# Patient Record
Sex: Female | Born: 1960 | Race: White | Hispanic: No | Smoking: Former smoker
Health system: Southern US, Community
[De-identification: ages and names within clinical notes are randomized; demographics above are authoritative.]

## PROBLEM LIST (undated history)

## (undated) DIAGNOSIS — R112 Nausea with vomiting, unspecified: Secondary | ICD-10-CM

## (undated) DIAGNOSIS — I10 Essential (primary) hypertension: Secondary | ICD-10-CM

## (undated) DIAGNOSIS — G4730 Sleep apnea, unspecified: Secondary | ICD-10-CM

## (undated) DIAGNOSIS — Z87442 Personal history of urinary calculi: Secondary | ICD-10-CM

## (undated) DIAGNOSIS — Z9889 Other specified postprocedural states: Secondary | ICD-10-CM

## (undated) HISTORY — DX: Sleep apnea, unspecified: G47.30

## (undated) HISTORY — DX: Essential (primary) hypertension: I10

## (undated) HISTORY — PX: ABDOMINAL HYSTERECTOMY: SHX81

## (undated) HISTORY — PX: OTHER SURGICAL HISTORY: SHX169

---

## 2016-02-10 ENCOUNTER — Other Ambulatory Visit: Payer: Self-pay | Admitting: General Surgery

## 2016-02-23 ENCOUNTER — Other Ambulatory Visit (HOSPITAL_COMMUNITY): Payer: Self-pay | Admitting: General Surgery

## 2016-02-23 DIAGNOSIS — Z6841 Body Mass Index (BMI) 40.0 and over, adult: Principal | ICD-10-CM

## 2016-03-02 ENCOUNTER — Ambulatory Visit (HOSPITAL_COMMUNITY)
Admission: RE | Admit: 2016-03-02 | Discharge: 2016-03-02 | Disposition: A | Discharge status: Home / Self Care | Payer: BLUE CROSS/BLUE SHIELD | Source: Ambulatory Visit | Attending: General Surgery | Admitting: General Surgery

## 2016-03-02 DIAGNOSIS — Z6841 Body Mass Index (BMI) 40.0 and over, adult: Secondary | ICD-10-CM | POA: Insufficient documentation

## 2016-03-02 DIAGNOSIS — K3184 Gastroparesis: Secondary | ICD-10-CM | POA: Diagnosis not present

## 2016-03-11 ENCOUNTER — Ambulatory Visit: Payer: Self-pay | Admitting: Dietician

## 2016-03-21 ENCOUNTER — Encounter: Payer: Self-pay | Admitting: Dietician

## 2016-03-21 ENCOUNTER — Encounter: Payer: BLUE CROSS/BLUE SHIELD | Attending: General Surgery | Admitting: Dietician

## 2016-03-21 DIAGNOSIS — Z01818 Encounter for other preprocedural examination: Secondary | ICD-10-CM | POA: Insufficient documentation

## 2016-03-21 NOTE — Progress Notes (Signed)
  Pre-Op Assessment Visit:  Pre-Operative RYGB Surgery  Medical Nutrition Therapy:  Appt start time: 1100   End time:  1130.  Patient was seen on 03/21/2016 for Pre-Operative Nutrition Assessment. Assessment and letter of approval faxed to [facility] coordinator on 03/21/2016.   Preferred Learning Style:   No preference indicated   Learning Readiness:   Ready  Handouts given during visit include:  Pre-Op Goals Bariatric Surgery Protein Shakes   During the appointment today the following Pre-Op Goals were reviewed with the patient: Maintain or lose weight as instructed by your surgeon Make healthy food choices Begin to limit portion sizes Limited concentrated sugars and fried foods Keep fat/sugar in the single digits per serving on   food labels Practice CHEWING your food  (aim for 30 chews per bite or until applesauce consistency) Practice not drinking 15 minutes before, during, and 30 minutes after each meal/snack Avoid all carbonated beverages  Avoid/limit caffeinated beverages  Avoid all sugar-sweetened beverages Consume 3 meals per day; eat every 3-5 hours Make a list of non-food related activities Aim for 64-100 ounces of FLUID daily  Aim for at least 60-80 grams of PROTEIN daily Look for a liquid protein source that contain ?15 g protein and ?5 g carbohydrate  (ex: shakes, drinks, shots)  Patient-Centered Goals: Goals: become more active, improving healthy, avoid diabetes and heart disease  9 confidence/9 importance scale  Demonstrated degree of understanding via:  Teach Back  Teaching Method Utilized:  Visual Auditory Hands on  Barriers to learning/adherence to lifestyle change: none  Patient to call the [facility] to enroll in Pre-Op and Post-Op Nutrition Education when surgery date is scheduled.

## 2016-03-21 NOTE — Patient Instructions (Signed)
Follow Pre-Op Goals Try Protein Shakes Call [facility] at [phone] when surgery is scheduled to enroll in Pre-Op Class  Things to remember:  Please always be honest with us. We want to support you!  If you have any questions or concerns in between appointments, please call or email Liz, Leslie, or Laurie.  The diet after surgery will be high protein and low in carbohydrate.  Vitamins and calcium need to be taken for the rest of your life.  Feel free to include support people in any classes or appointments.   Supplement recommendations:  Complete" Multivitamin: Sleeve Gastrectomy and RYGB patients take a double dose of MVI. LAGB patients take single dose as it is written on the package. Vitamin must be liquid or chewable but not gummy. Examples of these include Flintstones Complete and Centrum Complete. If the vitamin is bariatric-specific, take 1 dose as it is already formulated for bariatric surgery patients. Examples of these are Bariatric Advantage, Celebrate, and Wellesse. These can be found at the [facility] and/or online.     Calcium citrate: 1500 mg/day of Calcium citrate (also chewable or liquid) is recommended for all procedures. The body is only able to absorb 500-600 mg of Calcium at one time so 3 daily doses of 500 mg are recommended. Calcium doses must be taken a minimum of 2 hours apart. Additionally, Calcium must be taken 2 hours apart from iron-containing MVI. Examples of brands include Celebrate, Bariatric Advantage, and Wellesse. These brands must be purchased online or at the [facility]. Citracal Petites is the only Calcium citrate supplement found in general grocery stores and pharmacies. This is in tablet form and may be recommended for patients who do not tolerate chewable Calcium.  Continued or added Vitamin D supplementation based on individual needs.    Vitamin B12: 300-500 mcg/day for Sleeve Gastrectomy and RYGB. Optional for  LAGB patients as stomach remains fully intact. Must be taken intramuscularly, sublingually, or inhaled nasally. Oral route is not recommended. 

## 2016-04-21 ENCOUNTER — Encounter: Payer: BLUE CROSS/BLUE SHIELD | Attending: General Surgery | Admitting: Dietician

## 2016-04-21 ENCOUNTER — Encounter: Payer: Self-pay | Admitting: Dietician

## 2016-04-21 DIAGNOSIS — Z01818 Encounter for other preprocedural examination: Secondary | ICD-10-CM | POA: Insufficient documentation

## 2016-04-21 NOTE — Progress Notes (Signed)
  3 Months Supervised Weight Loss Visit:   Pre-Operative RYGB Surgery  Medical Nutrition Therapy:  Appt start time: 1100 end time:  1115.  Primary concerns today: Supervised Weight Loss Visit. Returns with a 2 lbs weight gain since last month. Having a Premier protein shake for breakfast now. Was just eating one meal per day and working on having 3 meals per day consistently. Still not feeling too much hunger throughout the day, but is starting to feel hunger more now that she is eating. Not having fried foods and sometimes will have cakes like pound cakes about once every couple of weeks.   Working on chewing well and not drinking during meals. Not waiting 30 minutes to drink and drinking right before meals at this point.   Having knee and ankle issues so not able to exercise much.   Weight: 314.1 lbs BMI: 47.8  Preferred Learning Style:   No preference indicated   Learning Readiness:   Ready  24-hr recall: B (10 AM): Premier protein Snk (AM): none L (5 PM): sandwich peanut butter and jelly or lunch meat or Marie Calendar's chicken alfredo Snk (PM): none  D (10 PM): Special K or egg sandwich Snk (PM): none  Beverages: water, sweet tea, punch/fruit juice/raspberrry lemonade, soda rarely   Medications: see list   Recent physical activity:  none  Progress Towards Goal(s):  In progress.  Handouts given during visit include:  none   Nutritional Diagnosis:  [place]-3.3 Obesity related to past poor dietary habits and physical inactivity as evidenced by patient attending supervised weight loss for insurance approval of bariatric surgery.    Intervention:  Nutrition counseling provided. Plan: Continue to work on chewing well and not drinking during meals (15 minutes before and waiting 30 minutes after eating). Plan to exercise 3-4 x week in the pool.  Look for Phelps DodgeSmart Ones, WellPointLean Cuisine, or Healthy Choice frozen (ones with meat/protein with vegetables) instead of pasta.  Choose lean  protein and vegetables for most meals. Work on having all drinks sugar free (Crystal Light or Diet V8 Splash).  Teaching Method Utilized:  Visual Auditory Hands on  Barriers to learning/adherence to lifestyle change: none  Demonstrated degree of understanding via:  Teach Back   Monitoring/Evaluation:  Dietary intake, exercise, and body weight. Follow up in 1 months for 3 month supervised weight loss visit.

## 2016-04-21 NOTE — Patient Instructions (Addendum)
Continue to work on chewing well and not drinking during meals (15 minutes before and waiting 30 minutes after eating). Plan to exercise 3-4 x week in the pool.  Look for Phelps DodgeSmart Ones, WellPointLean Cuisine, or Healthy Choice frozen (ones with meat/protein with vegetables) instead of pasta.  Choose lean protein and vegetables for most meals. Work on having all drinks sugar free (Crystal Light or Diet V8 Splash).

## 2016-05-23 ENCOUNTER — Encounter: Payer: BLUE CROSS/BLUE SHIELD | Attending: General Surgery | Admitting: Dietician

## 2016-05-23 ENCOUNTER — Encounter: Payer: Self-pay | Admitting: Dietician

## 2016-05-23 DIAGNOSIS — Z01818 Encounter for other preprocedural examination: Secondary | ICD-10-CM | POA: Diagnosis not present

## 2016-05-23 NOTE — Progress Notes (Signed)
  3 Months Supervised Weight Loss Visit:   Pre-Operative RYGB Surgery  Medical Nutrition Therapy:  Appt start time: 1100 end time:  1115.  Primary concerns today: Supervised Weight Loss Visit. Returns with a 4 lbs weight loss since last month. Having Smart Ones for lunch, exercising in the pool 1-2 x well, no longer drinking raspberry lemonade, and having crystal light. Sometimes has a second protein shake a day (has 1 for breakfast). Still cutting back on sweets (having them less than 1 x week).   Working on chewing well and not drinking during meals. Not waiting 30 minutes to drink and drinking right before meals at this point.   Weight: 310.1 lbs BMI: 47.2  Preferred Learning Style:   No preference indicated   Learning Readiness:   Ready  24-hr recall: B (10 AM): Premier protein Snk (AM): none L (5 PM): sandwich peanut butter and jelly or lunch meat or Smart Ones Snk (PM): none  D (10 PM): Special K or egg sandwich Snk (PM): none  Beverages: water, crystal light  Medications: see list   Recent physical activity:  none  Progress Towards Goal(s):  In progress.  Handouts given during visit include:  none   Nutritional Diagnosis:  [place]-3.3 Obesity related to past poor dietary habits and physical inactivity as evidenced by patient attending supervised weight loss for insurance approval of bariatric surgery.    Intervention:  Nutrition counseling provided. Plan: Continue to work on chewing well and not drinking during meals (15 minutes before and waiting 30 minutes after eating). Plan to exercise 3-4 x week in the pool.  Choose lean protein and vegetables for most meals.  Teaching Method Utilized:  Visual Auditory Hands on  Barriers to learning/adherence to lifestyle change: none  Demonstrated degree of understanding via:  Teach Back   Monitoring/Evaluation:  Dietary intake, exercise, and body weight. Follow up in 1 months for 3 month supervised weight loss  visit.

## 2016-05-23 NOTE — Patient Instructions (Signed)
Continue to work on chewing well and not drinking during meals (15 minutes before and waiting 30 minutes after eating). Plan to exercise 3-4 x week in the pool.  Choose lean protein and vegetables for most meals.

## 2016-06-21 ENCOUNTER — Encounter: Payer: BLUE CROSS/BLUE SHIELD | Attending: General Surgery | Admitting: Dietician

## 2016-06-21 ENCOUNTER — Encounter: Payer: Self-pay | Admitting: Dietician

## 2016-06-21 DIAGNOSIS — Z01818 Encounter for other preprocedural examination: Secondary | ICD-10-CM | POA: Diagnosis not present

## 2016-06-21 NOTE — Progress Notes (Signed)
  3 Months Supervised Weight Loss Visit:   Pre-Operative RYGB Surgery  Medical Nutrition Therapy:  Appt start time: 1105 end time:  1120.  Primary concerns today: Supervised Weight Loss Visit. Returns with no weight change. Still going to the pool 2 x per week. Was on vacation last week. Still struggling with chewing since she is missing some teeth. And feels like she has to drink to clear her mouth out.   Weight: 310.1 lbs BMI: 47.2  Preferred Learning Style:   No preference indicated   Learning Readiness:   Ready  24-hr recall: B (10 AM): Premier protein Snk (AM): none L (5 PM): sandwich peanut butter and jelly or lunch meat or Smart Ones Snk (PM): none  D (10 PM): Special K or egg sandwich Snk (PM): none  Beverages: water, crystal light  Medications: see list   Recent physical activity:  none  Progress Towards Goal(s):  In progress.  Handouts given during visit include:  none   Nutritional Diagnosis:  [place]-3.3 Obesity related to past poor dietary habits and physical inactivity as evidenced by patient attending supervised weight loss for insurance approval of bariatric surgery.    Intervention:  Nutrition counseling provided. Plan: Continue to work on chewing well and not drinking during meals (15 minutes before and waiting 30 minutes after eating). Plan to exercise 3-4 x week in the pool.  Choose lean protein and vegetables for most meals.  Teaching Method Utilized:  Visual Auditory Hands on  Barriers to learning/adherence to lifestyle change: none  Demonstrated degree of understanding via:  Teach Back   Monitoring/Evaluation:  Dietary intake, exercise, and body weight. Follow up to attend Pre Op Class

## 2016-06-21 NOTE — Patient Instructions (Addendum)
Continue to work on chewing well and not drinking during meals (15 minutes before and waiting 30 minutes after eating). Plan to exercise 3-4 x week in the pool.  Choose lean protein and vegetables for most meals.  Supplement recommendations:  Complete" Multivitamin: Sleeve Gastrectomy and RYGB patients take a double dose of MVI. LAGB patients take single dose as it is written on the package. Vitamin must be liquid or chewable but not gummy. Examples of these include Flintstones Complete and Centrum Complete. If the vitamin is bariatric-specific, take 1 dose as it is already formulated for bariatric surgery patients. Examples of these are Bariatric Advantage, Celebrate, and [employer]. These can be found at the [facility] and/or online.     Calcium citrate: 1500 mg/day of Calcium citrate (also chewable or liquid) is recommended for all procedures. The body is only able to absorb 500-600 mg of Calcium at one time so 3 daily doses of 500 mg are recommended. Calcium doses must be taken a minimum of 2 hours apart. Additionally, Calcium must be taken 2 hours apart from iron-containing MVI. Examples of brands include Celebrate, Bariatric Advantage, and Wellesse. These brands must be purchased online or at the [facility]. Citracal Petites is the only Calcium citrate supplement found in general grocery stores and pharmacies. This is in tablet form and may be recommended for patients who do not tolerate chewable Calcium.  Continued or added Vitamin D supplementation based on individual needs.    Vitamin B12: 300-500 mcg/day for Sleeve Gastrectomy and RYGB. Optional for LAGB patients as stomach remains fully intact. Must be taken intramuscularly, sublingually, or inhaled nasally. Oral route is not recommended.

## 2016-08-29 ENCOUNTER — Ambulatory Visit (INDEPENDENT_AMBULATORY_CARE_PROVIDER_SITE_OTHER): Payer: BLUE CROSS/BLUE SHIELD | Admitting: Pulmonary Disease

## 2016-08-29 ENCOUNTER — Encounter: Payer: Self-pay | Admitting: Pulmonary Disease

## 2016-08-29 DIAGNOSIS — G4733 Obstructive sleep apnea (adult) (pediatric): Secondary | ICD-10-CM | POA: Diagnosis not present

## 2016-08-29 MED ORDER — FLUTICASONE PROPIONATE 50 MCG/ACT NA SUSP: 2.0000 | Freq: Every day | NASAL | 10 refills | Status: AC

## 2016-08-29 NOTE — Progress Notes (Signed)
PULMONARY/SLEEP CONSULT NOTE  Requesting MD/Service: Gaynelle Adu Date of initial consultation: 08/29/16 Reason for consultation: OSA  PT PROFILE: 28 Higgins with OSA who requires treatment prior to consideration for gastric bypass surgery  DATA: PSG 11/17/15: AHI 54/hr. Mostly hypopneas. Lowest SpO2 80%. SpO2 below 88% ten percent of time. CPAP recommendation 8 cm H2O Epworth 08/29/16: 0    HPI:  Haley Higgins who is undergoing evaluation for bariatric surgery. In the course of that evaluation, she underwent sleep study which revealed severe OSA (AHI 54/hr). She is required to undergo evaluation and treatment of this prior to surgery. She denies daytime sleepiness and scored a 0 on Epworth sleepiness scale. She is unaware if she snores heavily. She denies nocturnal awakenings due to gasping or choking. She denies AM headache. She was previosuly prescribed CPAP but did not initiate it due to cost  Past Medical History:  Diagnosis Date  . Hypertension   . Sleep apnea     Past Surgical History:  Procedure Laterality Date  . ABDOMINAL HYSTERECTOMY    . CESAREAN SECTION      MEDICATIONS: I have reviewed all medications and confirmed regimen as documented  Social History   Social History  . Marital status: Divorced    Spouse name: N/A  . Number of children: N/A  . Years of education: N/A   Occupational History  . Not on file.   Social History Main Topics  . Smoking status: Former Smoker    Packs/day: 1.00    Years: 20.00    Types: Cigarettes  . Smokeless tobacco: Never Used     Comment: quit smoking 2007  . Alcohol use No  . Drug use: No  . Sexual activity: Not on file   Other Topics Concern  . Not on file   Social History Narrative  . No narrative on file    Family History  Problem Relation Age of Onset  . Heart disease Mother   . Diabetes Mother   . Pancreatic cancer Father   . Diabetes Father   . Diabetes Sister   . Heart disease Brother     ROS: No fever,  myalgias/arthralgias, unexplained weight loss or weight gain No new focal weakness or sensory deficits No otalgia, hearing loss, visual changes, nasal and sinus symptoms, mouth and throat problems No neck pain or adenopathy No abdominal pain, N/V/D, diarrhea, change in bowel pattern No dysuria, change in urinary pattern   Vitals:   08/29/16 1023  BP: 132/82  Pulse: 89  SpO2: 97%  Weight: (!) 309 lb (140.2 kg)  Height: 5\' 9"  (1.753 m)     EXAM:  Gen: Obese, No overt respiratory distress HEENT: NCAT, sclera white, oropharynx normal, very mil rhinitis Neck: Supple without LAN, thyromegaly, JVD Lungs: breath sounds: full, percussion: normal, No adventitious sounds Cardiovascular: RRR, no murmurs noted Abdomen: Soft, nontender, normal BS Ext: without clubbing, cyanosis, edema Neuro: CNs grossly intact, motor and sensory intact Skin: Limited exam, no lesions noted  DATA:   No flowsheet data found.  No flowsheet data found.  CXR:    IMPRESSION:     ICD-9-CM ICD-10-CM   1. Obesity, morbid (HCC) 278.01 E66.01   2. OSA (obstructive sleep apnea) 327.23 G47.33 Ambulatory Referral for DME  3. Very mild rhinitis  OSA is sever by AHI but she is remarkably asymptomatic. Nonetheless, given severity, it warrants attempt at treatment  PLAN:  1) Initiate CPAP with autoset machine 5-20 cm H2O 2) Flonase - 2 sprays per nostril  daily 3) ROV 6 weeks   Billy Fischeravid Cohan Stipes, MD PCCM service Mobile [phone] Pager [phone] 08/29/2016

## 2016-08-29 NOTE — Patient Instructions (Signed)
Begin CPAP - autoset Follow up in 4-6 weeks

## 2016-09-02 ENCOUNTER — Telehealth: Payer: Self-pay | Admitting: Pulmonary Disease

## 2016-09-02 NOTE — Telephone Encounter (Signed)
Pt was told to call us if she did not hear from company who was to help get her  CPAP machine  Please advise.

## 2016-09-02 NOTE — Telephone Encounter (Signed)
[facility]. Pt advised that it can take a week or so to hear from DME company.  Will route to [place] to f/u.

## 2016-09-02 NOTE — Telephone Encounter (Signed)
ATC patient, no answer.  LMOAM for pt to return my call to [phone] to advise of status of [facility] with [facility]. Rhonda J Cobb

## 2016-09-02 NOTE — Telephone Encounter (Signed)
Spoke with patient and patient is aware that we are trying to locate her sleep study, and that she may have to have another sleep study per [facility]. Rhonda J Cobb

## 2016-09-05 ENCOUNTER — Other Ambulatory Visit: Payer: Self-pay | Admitting: *Deleted

## 2016-09-05 DIAGNOSIS — G4733 Obstructive sleep apnea (adult) (pediatric): Secondary | ICD-10-CM

## 2016-09-05 NOTE — Telephone Encounter (Signed)
Sleep Study received and will have scanned into Epic. Haley Higgins Pt is aware. Haley Higgins

## 2016-09-05 NOTE — Telephone Encounter (Signed)
Called and spoke with patient to locate sleep study. Pt stated that Dr. Andrey CampanileWilson ordered the study and should be able to obtain study from his office. Called and spoke with Scarlette CalicoFrances at Dr. Tawana ScaleWilson's office and she will fax sleep study to us at [phone]. Rhonda J Cobb

## 2016-09-26 ENCOUNTER — Ambulatory Visit: Payer: BLUE CROSS/BLUE SHIELD | Admitting: Internal Medicine

## 2016-10-05 ENCOUNTER — Institutional Professional Consult (permissible substitution): Payer: BLUE CROSS/BLUE SHIELD | Admitting: Pulmonary Disease

## 2016-10-10 ENCOUNTER — Encounter: Payer: BLUE CROSS/BLUE SHIELD | Attending: General Surgery | Admitting: Dietician

## 2016-10-10 ENCOUNTER — Encounter: Payer: Self-pay | Admitting: Dietician

## 2016-10-10 DIAGNOSIS — I10 Essential (primary) hypertension: Secondary | ICD-10-CM | POA: Diagnosis not present

## 2016-10-10 DIAGNOSIS — M1711 Unilateral primary osteoarthritis, right knee: Secondary | ICD-10-CM | POA: Diagnosis not present

## 2016-10-10 DIAGNOSIS — Z713 Dietary counseling and surveillance: Secondary | ICD-10-CM | POA: Diagnosis present

## 2016-10-10 DIAGNOSIS — R7303 Prediabetes: Secondary | ICD-10-CM | POA: Diagnosis not present

## 2016-10-10 DIAGNOSIS — Z6841 Body Mass Index (BMI) 40.0 and over, adult: Secondary | ICD-10-CM | POA: Diagnosis not present

## 2016-10-10 DIAGNOSIS — M47817 Spondylosis without myelopathy or radiculopathy, lumbosacral region: Secondary | ICD-10-CM | POA: Insufficient documentation

## 2016-10-10 NOTE — Progress Notes (Signed)
  Pre-Operative Nutrition Class:  Appt start time: 830   End time:  930.  Patient was seen on 10/10/2016 for Pre-Operative Bariatric Surgery Education at the [facility].   Surgery date:  Surgery type: RYGB Start weight at [facility]: 313 lbs on 03/21/2016 Weight today: 308.8 lbs  TANITA  BODY COMP RESULTS  10/10/16   BMI (kg/m^2) 47   Fat Mass (lbs) 175.4   Fat Free Mass (lbs) 133.4   Total Body Water (lbs) 99.6   Samples given per MNT protocol. Patient educated on appropriate usage: Bariatric Advantage multivitamin (mixed fruit - qty 1) Lot #: [id number] Exp: 09/2017  Premier protein shake (strawberry - qty 1) Lot #: [id number] Exp: 08/2017  Bariatric Advantage Vitamins Calcium Citrate chew (lemon - qty 1) Lot #: [id number] Exp: 04/2017  The following the learning objectives were met by the patient during this course:  Identify Pre-Op Dietary Goals and will begin 2 weeks pre-operatively  Identify appropriate sources of fluids and proteins   State protein recommendations and appropriate sources pre and post-operatively  Identify Post-Operative Dietary Goals and will follow for 2 weeks post-operatively  Identify appropriate multivitamin and calcium sources  Describe the need for physical activity post-operatively and will follow MD recommendations  State when to call healthcare provider regarding medication questions or post-operative complications  Handouts given during class include:  Pre-Op Bariatric Surgery Diet Handout  Protein Shake Handout  Post-Op Bariatric Surgery Nutrition Handout  BELT Program Information Flyer  Support Group Information Flyer  [facility] Bariatric Supplements Price List  Follow-Up Plan: Patient will follow-up at [facility] 2 weeks post operatively for diet advancement per MD.

## 2016-10-11 ENCOUNTER — Ambulatory Visit: Payer: BLUE CROSS/BLUE SHIELD | Admitting: Pulmonary Disease

## 2016-10-18 NOTE — Progress Notes (Signed)
Surgery on 12/5.  Needs orders in EPIc.

## 2016-10-18 NOTE — Patient Instructions (Signed)
Jolyne LoaSusan Templeman  10/18/2016   Your procedure is scheduled on: 10/25/2016    [facility]  [place]  elevators to 3rd floor to  Short Stay Center at   0515 AM.  Call this number if you have problems the morning of surgery [phone]   Remember: ONLY 1 PERSON MAY GO WITH YOU TO SHORT STAY TO GET  READY MORNING OF YOUR SURGERY.  Do not eat food or drink liquids :After Midnight.     Take these medicines the morning of surgery with A SIP OF WATER: flonase                                 You may not have any metal on your body including hair pins and              piercings  Do not wear jewelry, make-up, lotions, powders or perfumes, deodorant             Do not wear nail polish.  Do not shave  48 hours prior to surgery.                 Do not bring valuables to the hospital. [facility] IS NOT             RESPONSIBLE   FOR VALUABLES.  Contacts, dentures or bridgework may not be worn into surgery.  Leave suitcase in the car. After surgery it may be brought to your room.       Special Instructions: N/A              Please read over the following fact sheets you were given: _____________________________________________________________________             [facility] - Preparing for [facility] Before surgery, you can play an important role.  Because skin is not sterile, your skin needs to be as free of germs as possible.  You can reduce the number of germs on your skin by washing with CHG (chlorahexidine gluconate) soap before surgery.  CHG is an antiseptic cleaner which kills germs and bonds with the skin to continue killing germs even after washing. Please DO NOT use if you have an allergy to CHG or antibacterial soaps.  If your skin becomes reddened/irritated stop using the CHG and inform your nurse when you arrive at Short Stay. Do not shave (including legs and underarms) for at least 48 hours prior to the first CHG shower.  You may shave  your face/neck. Please follow these instructions carefully:  1.  Shower with CHG Soap the night before surgery and the  morning of Surgery.  2.  If you choose to wash your hair, wash your hair first as usual with your  normal  shampoo.  3.  After you shampoo, rinse your hair and body thoroughly to remove the  shampoo.                           4.  Use CHG as you would any other liquid soap.  You can apply chg directly  to the skin and wash                       Gently with a scrungie or clean washcloth.  5.  Apply the CHG Soap to your body ONLY FROM THE NECK DOWN.   Do not use on face/ open                           Wound or open sores. Avoid contact with eyes, ears mouth and genitals (private parts).                       Wash face,  Genitals (private parts) with your normal soap.             6.  Wash thoroughly, paying special attention to the area where your surgery  will be performed.  7.  Thoroughly rinse your body with warm water from the neck down.  8.  DO NOT shower/wash with your normal soap after using and rinsing off  the CHG Soap.                9.  Pat yourself dry with a clean towel.            10.  Wear clean pajamas.            11.  Place clean sheets on your bed the night of your first shower and do not  sleep with pets. Day of Surgery : Do not apply any lotions/deodorants the morning of surgery.  Please wear clean clothes to the hospital/surgery center.  FAILURE TO FOLLOW THESE INSTRUCTIONS MAY RESULT IN THE CANCELLATION OF YOUR SURGERY PATIENT SIGNATURE_________________________________  NURSE SIGNATURE__________________________________  ________________________________________________________________________

## 2016-10-19 ENCOUNTER — Ambulatory Visit: Payer: Self-pay | Admitting: General Surgery

## 2016-10-19 ENCOUNTER — Ambulatory Visit (HOSPITAL_COMMUNITY)
Admission: RE | Admit: 2016-10-19 | Discharge: 2016-10-19 | Disposition: A | Discharge status: Home / Self Care | Payer: BLUE CROSS/BLUE SHIELD | Source: Ambulatory Visit | Attending: General Surgery | Admitting: General Surgery

## 2016-10-19 ENCOUNTER — Encounter (HOSPITAL_COMMUNITY): Payer: Self-pay

## 2016-10-19 ENCOUNTER — Encounter (HOSPITAL_COMMUNITY)
Admission: RE | Admit: 2016-10-19 | Discharge: 2016-10-19 | Disposition: A | Discharge status: Home / Self Care | Payer: BLUE CROSS/BLUE SHIELD | Source: Ambulatory Visit | Attending: General Surgery | Admitting: General Surgery

## 2016-10-19 DIAGNOSIS — I700 Atherosclerosis of aorta: Secondary | ICD-10-CM | POA: Insufficient documentation

## 2016-10-19 DIAGNOSIS — Z87891 Personal history of nicotine dependence: Secondary | ICD-10-CM | POA: Diagnosis not present

## 2016-10-19 DIAGNOSIS — Z01818 Encounter for other preprocedural examination: Secondary | ICD-10-CM | POA: Diagnosis not present

## 2016-10-19 DIAGNOSIS — G4730 Sleep apnea, unspecified: Secondary | ICD-10-CM | POA: Insufficient documentation

## 2016-10-19 HISTORY — DX: Personal history of urinary calculi: Z87.442

## 2016-10-19 HISTORY — DX: Nausea with vomiting, unspecified: R11.2

## 2016-10-19 HISTORY — DX: Other specified postprocedural states: Z98.890

## 2016-10-19 LAB — CBC WITH DIFFERENTIAL/PLATELET
BASOS PCT: 1 %
Basophils Absolute: 0 10*3/uL (ref 0.0–0.1)
EOS ABS: 0.3 10*3/uL (ref 0.0–0.7)
Eosinophils Relative: 5 %
HEMATOCRIT: 43.3 % (ref 36.0–46.0)
HEMOGLOBIN: 14.2 g/dL (ref 12.0–15.0)
LYMPHS ABS: 2.1 10*3/uL (ref 0.7–4.0)
Lymphocytes Relative: 32 %
MCH: 28 pg (ref 26.0–34.0)
MCHC: 32.8 g/dL (ref 30.0–36.0)
MCV: 85.2 fL (ref 78.0–100.0)
MONO ABS: 0.4 10*3/uL (ref 0.1–1.0)
MONOS PCT: 6 %
NEUTROS ABS: 3.9 10*3/uL (ref 1.7–7.7)
Neutrophils Relative %: 56 %
Platelets: 271 10*3/uL (ref 150–400)
RBC: 5.08 MIL/uL (ref 3.87–5.11)
RDW: 13.7 % (ref 11.5–15.5)
WBC: 6.8 10*3/uL (ref 4.0–10.5)

## 2016-10-19 LAB — COMPREHENSIVE METABOLIC PANEL
ALBUMIN: 3.9 g/dL (ref 3.5–5.0)
ALK PHOS: 75 U/L (ref 38–126)
ALT: 27 U/L (ref 14–54)
ANION GAP: 8 (ref 5–15)
AST: 28 U/L (ref 15–41)
BILIRUBIN TOTAL: 0.9 mg/dL (ref 0.3–1.2)
BUN: 12 mg/dL (ref 6–20)
CALCIUM: 9.3 mg/dL (ref 8.9–10.3)
CO2: 26 mmol/L (ref 22–32)
Chloride: 102 mmol/L (ref 101–111)
Creatinine, Ser: 0.86 mg/dL (ref 0.44–1.00)
GFR calc non Af Amer: >60 mL/min (ref >60)
GLUCOSE: 141 mg/dL — AB (ref 65–99)
POTASSIUM: 3.7 mmol/L (ref 3.5–5.1)
Sodium: 136 mmol/L (ref 135–145)
TOTAL PROTEIN: 7 g/dL (ref 6.5–8.1)

## 2016-10-19 NOTE — Progress Notes (Signed)
AT preop appointment patient had not yet been given bowel prep instructions.  Called office of [facility] and spoke with Nettie ElmSylvia and she told me to have patient come by the office and pick up bowel prep instructions.  Patient instructed to go by office after preop appointment and pick up bowel prep instructions.  Patient voiced understanding.

## 2016-10-19 NOTE — Progress Notes (Signed)
EKG- 03/02/16- EPIC  

## 2016-10-20 ENCOUNTER — Ambulatory Visit: Payer: Self-pay | Admitting: General Surgery

## 2016-10-20 NOTE — H&P (Signed)
Haley Higgins 10/20/2016 3:08 PM Location: Standard Office Patient #: [phone] DOB: [birth year] Divorced / Language: Cleophus Molt / Race: White Female  History of Present Illness Randall Hiss M. Tyler Cubit MD; 10/20/2016 3:47 PM) Patient words: preop.  The patient is a 55 year old female who presents for a pre-op visit. she comes in today for her preoperative visit. She has been scheduled and approved for laparoscopic Roux-en-Y gastric bypass. I initially met her in March 2017. Her weight at that time was 311 pounds. She denies any changes since she was initially seen. She finally agreed to see a pulmonologist to rediscuss CPAP therapy for her positive sleep study. She has been on CPAP for a few weeks. She has a nasal mask. But she complains that it dries out her nose. She has a follow-up appointment with the pulmonologist this coming Monday. Otherwise she denies any medical changes. She denies any chest pain, chest pressure, shortness of breath, dyspnea on exertion, reflux, melena, hematochezia, dysuria, hematuria.  Her upper GI was unremarkable. Her evaluation labs showed a triglyceride level of 155, HDL level was low at 43, she has a diagnosis of subclinical hypothyroidism and her TSH was 5.61. Her hemoglobin A1c was 6.4.   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 Hysterectomy (not due to cancer) - Complete Oral Surgery Cesarean Section - 1  Diagnostic Studies History Gayland Curry, MD; 10/20/2016 3:48 PM) Colonoscopy  within last year Mammogram within last year Pap Smear >5 years ago  Allergies Aleatha Borer, LPN; 78/29/5621 3:08 PM) No Known Drug Allergies 02/10/2016  Medication History Gayland Curry, MD; 10/20/2016 3:48 PM) Lisinopril-Hydrochlorothiazide (10-12.5MG Tablet, Oral) Active. Medications Reconciled Pantoprazole Sodium (40MG Tablet DR, 1 (one) Tablet Oral daily, Taken starting 10/20/2016) Active. Ondansetron (4MG Tablet Disint, 1 (one) Tablet Oral every six hours, as needed, Taken starting 10/20/2016) Active. OxyCODONE HCl (5MG/5ML Solution, 5-10 Milliliter Oral every four hours, as needed, Taken starting 10/20/2016) Active.  Social History Gayland Curry, MD; 10/20/2016 3:48 PM) Tobacco use Former smoker. Alcohol use Occasional alcohol use. No drug use Caffeine use Tea.  Family History Gayland Curry, MD; 10/20/2016 3:48 PM) Colon Cancer Sister. Malignant Neoplasm Of Pancreas Father. Hypertension Mother. Diabetes Mellitus Father, Mother, Sister. Heart Disease Brother, Father, Mother. Heart disease in female family member before age 37  Pregnancy / Birth History Gayland Curry, MD; 10/20/2016 3:48 PM) Age at menarche 63 years. Gravida 4 Contraceptive History Oral contraceptives. Para 5 Maternal age 50-20     Review of Systems Randall Hiss M. Amita Atayde MD; 10/20/2016 3:44 PM) General Not Present- Appetite Loss, Chills, Fatigue, Fever, Night Sweats, Weight Gain and Weight Loss. Skin Present- Dryness. Not Present- Change in Wart/Mole, Hives, Jaundice, New Lesions, Non-Healing Wounds, Rash and Ulcer. HEENT Present- Wears glasses/contact lenses. Not Present- Earache, Hearing Loss, Hoarseness, Nose Bleed, Oral Ulcers, Ringing in the Ears, Seasonal Allergies, Sinus Pain, Sore Throat, Visual Disturbances and Yellow Eyes. Respiratory Not Present- Bloody sputum, Chronic Cough, Difficulty Breathing, Snoring and Wheezing. Breast Not Present- Breast Mass, Breast Pain, Nipple  Discharge and Skin Changes. Cardiovascular Not Present- Chest Pain, Difficulty Breathing Lying Down, Leg Cramps, Palpitations, Rapid Heart  Rate, Shortness of Breath and Swelling of Extremities. Gastrointestinal Present- Hemorrhoids. Not Present- Abdominal Pain, Bloating, Bloody Stool, Change in Bowel Habits, Chronic diarrhea, Constipation, Difficulty Swallowing, Excessive gas, Gets full quickly at meals, Indigestion, Nausea, Rectal Pain and Vomiting. Female Genitourinary Present- Urgency. Not Present- Frequency, Nocturia, Painful Urination and Pelvic Pain. Musculoskeletal Present- Back Pain. Not Present- Joint Pain, Joint Stiffness, Muscle Pain, Muscle Weakness and Swelling of Extremities. Neurological Not Present- Decreased Memory, Fainting, Headaches, Numbness, Seizures, Tingling, Tremor, Trouble walking and Weakness. Psychiatric Not Present- Anxiety, Bipolar, Change in Sleep Pattern, Depression, Fearful and Frequent crying. Endocrine Not Present- Cold Intolerance, Excessive Hunger, Hair Changes, Heat Intolerance, Hot flashes and New Diabetes. Hematology Not Present- Easy Bruising, Excessive bleeding, Gland problems, HIV and Persistent Infections.  Vitals (Ammie Eversole LPN; 93/71/6967 8:93 PM) 10/20/2016 3:17 PM Weight: 309.4 lb Height: 68in Body Surface Area: 2.46 m Body Mass Index: 47.04 kg/m  Temp.: 98.71F(Oral)  Pulse: 86 (Regular)  BP: 118/78 (Sitting, Left Arm, Standard)      Physical Exam Randall Hiss M. Jamaria Amborn MD; 10/20/2016 3:44 PM)  General Mental Status-Alert. General Appearance-Consistent with stated age. Hydration-Well hydrated. Voice-Normal. Note: morbidly obese, evenly distributed  Head and Neck Head-normocephalic, atraumatic with no lesions or palpable masses. Trachea-midline. Thyroid Gland Characteristics - normal size and consistency.  Eye Eyeball - Bilateral-Extraocular movements intact. Sclera/Conjunctiva - Bilateral-No scleral  icterus.  Chest and Lung Exam Chest and lung exam reveals -quiet, even and easy respiratory effort with no use of accessory muscles and on auscultation, normal breath sounds, no adventitious sounds and normal vocal resonance. Inspection Chest Wall - Normal. Back - normal.  Breast - Did not examine.  Cardiovascular Cardiovascular examination reveals -normal heart sounds, regular rate and rhythm with no murmurs and normal pedal pulses bilaterally.  Abdomen Inspection Inspection of the abdomen reveals - No Hernias. Skin - Scar - no surgical scars. Palpation/Percussion Palpation and Percussion of the abdomen reveal - Soft, Non Tender, No Rebound tenderness, No Rigidity (guarding) and No hepatosplenomegaly. Auscultation Auscultation of the abdomen reveals - Bowel sounds normal.  Peripheral Vascular Upper Extremity Palpation - Pulses bilaterally normal.  Neurologic Neurologic evaluation reveals -alert and oriented x 3 with no impairment of recent or remote memory. Mental Status-Normal.  Neuropsychiatric The patient's mood and affect are described as -normal. Judgment and Insight-insight is appropriate concerning matters relevant to self.  Musculoskeletal Normal Exam - Left-Upper Extremity Strength Normal and Lower Extremity Strength Normal. Normal Exam - Right-Upper Extremity Strength Normal and Lower Extremity Strength Normal. Note: b/l knee crepitus  Lymphatic Head & Neck  General Head & Neck Lymphatics: Bilateral - Description - Normal. Axillary - Did not examine. Femoral & Inguinal - Did not examine.    Assessment & Plan Randall Hiss M. Alyric Parkin MD; 10/20/2016 3:44 PM)  MORBID OBESITY WITH BMI OF 45.0-49.9, ADULT (E66.01) Impression: wwe reviewed her preoperative workup. We discussed the results of her labs and upper GI. We disscussed the typical recovery course. We also discussed the typical hospital course. She was given her postoperative pain, heartburn, and  nausea prescriptions today. We discussed the importance of the preoperative bowel prep. we also discussed the preoperative diet plan.  Current Plans Started OxyCODONE HCl 5MG/5ML, 5-10 Milliliter every four hours, as needed, 150 Milliliter, 10/20/2016, No Refill. Started Pantoprazole Sodium 40MG, 1 (one) Tablet daily, #30, 30 days starting 10/20/2016, Ref. x3. Started Ondansetron 4MG, 1 (one) Tablet every six hours, as needed, #15, 10/20/2016, No Refill. Pt Education - EMW_preopbariatric PREDIABETES (R73.03)  OSTEOARTHRITIS OF RIGHT KNEE,  UNSPECIFIED OSTEOARTHRITIS TYPE (M17.11)  OBSTRUCTIVE SLEEP APNEA (G47.33) Impression: wwe discussed that often times there are issues when first going on CPAP. She has a follow-up appointment with the pulmonologist is coming Monday.  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 Redmond Pulling, MD, FACS General, Bariatric, & Minimally Invasive Surgery [facility], [place]

## 2016-10-24 ENCOUNTER — Ambulatory Visit (INDEPENDENT_AMBULATORY_CARE_PROVIDER_SITE_OTHER): Payer: BLUE CROSS/BLUE SHIELD | Admitting: Pulmonary Disease

## 2016-10-24 ENCOUNTER — Encounter: Payer: Self-pay | Admitting: Pulmonary Disease

## 2016-10-24 VITALS — BP 134/72 | HR 94 | Ht 69.0 in | Wt 310.0 lb

## 2016-10-24 DIAGNOSIS — G4733 Obstructive sleep apnea (adult) (pediatric): Secondary | ICD-10-CM | POA: Diagnosis not present

## 2016-10-24 NOTE — Patient Instructions (Signed)
-   Good luck with your surgery tomorrow - After the surgery, you can try on and off the CPAP to see if it is making any difference in your symptoms  - Follow up in 2-3 months or as neded - Happy Holidays!!

## 2016-10-24 NOTE — Progress Notes (Signed)
PULMONARY/SLEEP FOLLOW UP NOTE  Requesting MD/Service: Gaynelle AduEric Wilson Date of initial consultation: 08/29/16 Reason for consultation: OSA  PT PROFILE: [age] F with OSA who requires treatment prior to consideration for gastric bypass surgery  DATA: PSG 11/17/15: AHI 54/hr. Mostly hypopneas. Lowest SpO2 80%. SpO2 below 88% ten percent of time. CPAP recommendation 8 cm H2O [place]worth 08/29/16: 0  CPAP compliance 09/2016: > 4 hrs 28/30 nights. Average usage 6.5 hrs. Maximum pressure on autoset 11.5 cm H2O. Median pressure 6.5 cm H2O PFTs 10/04/16: No obstruction, moderate restriction, moderate reduction in DLCO which corrects fully for [facility] CXR 10/19/16: NACPD  SUBJ: No new complaints. She has been compliant with CPAP but is unable to discern any benefit in terms of symptoms of sleepiness since she reported no daytime sleepiness prior to initiation. Bariatric surgery (roux-en-Y) is scheduled for 10/25/16.   OBJ:  Vitals:   10/24/16 0954  BP: 134/72  Pulse: 94  SpO2: 95%  Weight: (!) 310 lb (140.6 kg)  Height: 5\' 9"  (1.753 m)     EXAM:  Gen: Obese, NAD HEENT: WNL Lungs: BS full, No adventitious sounds Cardiovascular: Reg, no murmurs Abdomen: Soft, nontender, normal BS Ext: without clubbing, cyanosis, edema Neuro: grossly intact  DATA:  CXR:  As above  IMPRESSION:     ICD-9-CM ICD-10-CM   1. OSA (obstructive sleep apnea) 327.23 G47.33    Her AHI indicates moderate to severe OSA but the respiratory disturbances are almost exclusively hypoopneas and she is remarkably asymptomatic. It is hard to know how much CPAP is benfiting her. The OSA should get better with anticipated weight loss after bariatric surgery  PLAN:  1) Cont CPAP with autoset machine 5-20 cm H2O. After surgery, she may try on/off CPAP to assess symptomatic benefit 2) Continue Flonase - 2 sprays per nostril daily 3) ROV 2-3 months   Billy Fischeravid Simonds, MD PCCM service Mobile [phone] Pager  [phone] 10/24/2016

## 2016-10-25 ENCOUNTER — Inpatient Hospital Stay (HOSPITAL_COMMUNITY)
Admission: RE | Admit: 2016-10-25 | Discharge: 2016-10-27 | DRG: 982 | Disposition: A | Discharge status: Home / Self Care | Payer: BLUE CROSS/BLUE SHIELD | Source: Ambulatory Visit | Attending: General Surgery | Admitting: General Surgery

## 2016-10-25 ENCOUNTER — Encounter (HOSPITAL_COMMUNITY)
Admission: RE | Disposition: A | Discharge status: Home / Self Care | Payer: Self-pay | Source: Ambulatory Visit | Attending: General Surgery

## 2016-10-25 ENCOUNTER — Inpatient Hospital Stay (HOSPITAL_COMMUNITY): Payer: BLUE CROSS/BLUE SHIELD | Admitting: Anesthesiology

## 2016-10-25 ENCOUNTER — Encounter (HOSPITAL_COMMUNITY): Payer: Self-pay | Admitting: *Deleted

## 2016-10-25 DIAGNOSIS — R7303 Prediabetes: Secondary | ICD-10-CM | POA: Diagnosis present

## 2016-10-25 DIAGNOSIS — G4733 Obstructive sleep apnea (adult) (pediatric): Secondary | ICD-10-CM | POA: Diagnosis present

## 2016-10-25 DIAGNOSIS — Z87891 Personal history of nicotine dependence: Secondary | ICD-10-CM

## 2016-10-25 DIAGNOSIS — Z6841 Body Mass Index (BMI) 40.0 and over, adult: Secondary | ICD-10-CM

## 2016-10-25 DIAGNOSIS — Z8249 Family history of ischemic heart disease and other diseases of the circulatory system: Secondary | ICD-10-CM

## 2016-10-25 DIAGNOSIS — M47817 Spondylosis without myelopathy or radiculopathy, lumbosacral region: Secondary | ICD-10-CM | POA: Diagnosis present

## 2016-10-25 DIAGNOSIS — Z800 Family history of malignant neoplasm of digestive organs: Secondary | ICD-10-CM

## 2016-10-25 DIAGNOSIS — Z833 Family history of diabetes mellitus: Secondary | ICD-10-CM | POA: Diagnosis not present

## 2016-10-25 DIAGNOSIS — E786 Lipoprotein deficiency: Secondary | ICD-10-CM | POA: Diagnosis present

## 2016-10-25 DIAGNOSIS — E662 Morbid (severe) obesity with alveolar hypoventilation: Principal | ICD-10-CM | POA: Diagnosis present

## 2016-10-25 DIAGNOSIS — I10 Essential (primary) hypertension: Secondary | ICD-10-CM | POA: Diagnosis present

## 2016-10-25 DIAGNOSIS — M1711 Unilateral primary osteoarthritis, right knee: Secondary | ICD-10-CM | POA: Diagnosis present

## 2016-10-25 DIAGNOSIS — Z9884 Bariatric surgery status: Secondary | ICD-10-CM

## 2016-10-25 DIAGNOSIS — Z9989 Dependence on other enabling machines and devices: Secondary | ICD-10-CM

## 2016-10-25 HISTORY — PX: GASTRIC ROUX-EN-Y: SHX5262

## 2016-10-25 LAB — HEMOGLOBIN AND HEMATOCRIT, BLOOD
HEMATOCRIT: 43.8 % (ref 36.0–46.0)
Hemoglobin: 14.2 g/dL (ref 12.0–15.0)

## 2016-10-25 SURGERY — LAPAROSCOPIC ROUX-EN-Y GASTRIC BYPASS WITH UPPER ENDOSCOPY
Anesthesia: General

## 2016-10-25 MED ORDER — FENTANYL CITRATE (PF) 250 MCG/5ML IJ SOLN
INTRAMUSCULAR | Status: AC
  Filled 2016-10-25: qty 5

## 2016-10-25 MED ORDER — PROMETHAZINE HCL 25 MG/ML IJ SOLN
6.2500 mg | INTRAMUSCULAR | Status: DC | PRN
  Administered 2016-10-25: 6.25 mg via INTRAVENOUS

## 2016-10-25 MED ORDER — PROMETHAZINE HCL 25 MG/ML IJ SOLN: 12.5000 mg | Freq: Four times a day (QID) | INTRAMUSCULAR | Status: DC | PRN

## 2016-10-25 MED ORDER — SODIUM CHLORIDE 0.9 % IJ SOLN
INTRAMUSCULAR | Status: AC
  Filled 2016-10-25: qty 50

## 2016-10-25 MED ORDER — BUPIVACAINE LIPOSOME 1.3 % IJ SUSP
20.0000 mL | Freq: Once | INTRAMUSCULAR | Status: DC
  Filled 2016-10-25: qty 20

## 2016-10-25 MED ORDER — FENTANYL CITRATE (PF) 100 MCG/2ML IJ SOLN
INTRAMUSCULAR | Status: DC | PRN
  Administered 2016-10-25 (×2): 50 ug via INTRAVENOUS
  Administered 2016-10-25: 100 ug via INTRAVENOUS

## 2016-10-25 MED ORDER — MIDAZOLAM HCL 2 MG/2ML IJ SOLN
INTRAMUSCULAR | Status: AC
  Filled 2016-10-25: qty 2

## 2016-10-25 MED ORDER — SUGAMMADEX SODIUM 500 MG/5ML IV SOLN
INTRAVENOUS | Status: DC | PRN
  Administered 2016-10-25: 300 mg via INTRAVENOUS

## 2016-10-25 MED ORDER — SCOPOLAMINE 1 MG/3DAYS TD PT72
MEDICATED_PATCH | TRANSDERMAL | Status: DC | PRN
  Administered 2016-10-25: 1 via TRANSDERMAL

## 2016-10-25 MED ORDER — ENOXAPARIN SODIUM 30 MG/0.3ML ~~LOC~~ SOLN
30.0000 mg | Freq: Two times a day (BID) | SUBCUTANEOUS | Status: DC
  Administered 2016-10-26 – 2016-10-27 (×3): 30 mg via SUBCUTANEOUS
  Filled 2016-10-25 (×3): qty 0.3

## 2016-10-25 MED ORDER — EPHEDRINE 5 MG/ML INJ
INTRAVENOUS | Status: AC
  Filled 2016-10-25: qty 10

## 2016-10-25 MED ORDER — CEFOTETAN DISODIUM-DEXTROSE 2-2.08 GM-% IV SOLR
2.0000 g | INTRAVENOUS | Status: AC
  Administered 2016-10-25: 2 g via INTRAVENOUS

## 2016-10-25 MED ORDER — DIPHENHYDRAMINE HCL 50 MG/ML IJ SOLN: 12.5000 mg | Freq: Three times a day (TID) | INTRAMUSCULAR | Status: DC | PRN

## 2016-10-25 MED ORDER — STERILE WATER FOR IRRIGATION IR SOLN
Status: DC | PRN
  Administered 2016-10-25: 1000 mL

## 2016-10-25 MED ORDER — SCOPOLAMINE 1 MG/3DAYS TD PT72
MEDICATED_PATCH | TRANSDERMAL | Status: AC
  Filled 2016-10-25: qty 1

## 2016-10-25 MED ORDER — PROPOFOL 10 MG/ML IV BOLUS
INTRAVENOUS | Status: DC | PRN
  Administered 2016-10-25: 250 mg via INTRAVENOUS
  Administered 2016-10-25: 20 mg via INTRAVENOUS

## 2016-10-25 MED ORDER — GLYCOPYRROLATE 0.2 MG/ML IJ SOLN
INTRAMUSCULAR | Status: DC | PRN
  Administered 2016-10-25: 0.2 mg via INTRAVENOUS

## 2016-10-25 MED ORDER — PREMIER PROTEIN SHAKE
2.0000 [oz_av] | ORAL | Status: DC
  Administered 2016-10-27 (×2): 2 [oz_av] via ORAL

## 2016-10-25 MED ORDER — PROPOFOL 10 MG/ML IV BOLUS
INTRAVENOUS | Status: AC
  Filled 2016-10-25: qty 40

## 2016-10-25 MED ORDER — HYDROMORPHONE HCL 1 MG/ML IJ SOLN
INTRAMUSCULAR | Status: AC
  Filled 2016-10-25: qty 0.5

## 2016-10-25 MED ORDER — ROCURONIUM BROMIDE 100 MG/10ML IV SOLN
INTRAVENOUS | Status: DC | PRN
  Administered 2016-10-25: 50 mg via INTRAVENOUS
  Administered 2016-10-25: 20 mg via INTRAVENOUS
  Administered 2016-10-25 (×2): 10 mg via INTRAVENOUS
  Administered 2016-10-25: 20 mg via INTRAVENOUS
  Administered 2016-10-25: 10 mg via INTRAVENOUS

## 2016-10-25 MED ORDER — DEXAMETHASONE SODIUM PHOSPHATE 10 MG/ML IJ SOLN
INTRAMUSCULAR | Status: AC
  Filled 2016-10-25: qty 1

## 2016-10-25 MED ORDER — 0.9 % SODIUM CHLORIDE (POUR BTL) OPTIME
TOPICAL | Status: DC | PRN
  Administered 2016-10-25: 1000 mL

## 2016-10-25 MED ORDER — OXYCODONE HCL 5 MG/5ML PO SOLN
5.0000 mg | ORAL | Status: DC | PRN
  Administered 2016-10-26: 10 mg via ORAL
  Filled 2016-10-25: qty 10

## 2016-10-25 MED ORDER — ROCURONIUM BROMIDE 50 MG/5ML IV SOSY
PREFILLED_SYRINGE | INTRAVENOUS | Status: AC
  Filled 2016-10-25: qty 5

## 2016-10-25 MED ORDER — EVICEL 5 ML EX KIT
PACK | Freq: Once | CUTANEOUS | Status: AC
Start: 2016-10-25 — End: 2016-10-25
  Administered 2016-10-25: 5 mL
  Filled 2016-10-25: qty 1

## 2016-10-25 MED ORDER — MORPHINE SULFATE (PF) 2 MG/ML IV SOLN
2.0000 mg | INTRAVENOUS | Status: DC | PRN
  Administered 2016-10-25 – 2016-10-26 (×3): 2 mg via INTRAVENOUS
  Filled 2016-10-25 (×3): qty 1

## 2016-10-25 MED ORDER — ONDANSETRON HCL 4 MG/2ML IJ SOLN: 4.0000 mg | INTRAMUSCULAR | Status: DC | PRN

## 2016-10-25 MED ORDER — DEXAMETHASONE SODIUM PHOSPHATE 10 MG/ML IJ SOLN
INTRAMUSCULAR | Status: DC | PRN
  Administered 2016-10-25: 10 mg via INTRAVENOUS

## 2016-10-25 MED ORDER — HEPARIN SODIUM (PORCINE) 5000 UNIT/ML IJ SOLN
5000.0000 [IU] | INTRAMUSCULAR | Status: AC
Start: 2016-10-25 — End: 2016-10-25
  Administered 2016-10-25: 5000 [IU] via SUBCUTANEOUS
  Filled 2016-10-25: qty 1

## 2016-10-25 MED ORDER — EPHEDRINE SULFATE 50 MG/ML IJ SOLN
INTRAMUSCULAR | Status: DC | PRN
Start: 2016-10-25 — End: 2016-10-25
  Administered 2016-10-25 (×3): 5 mg via INTRAVENOUS

## 2016-10-25 MED ORDER — LIDOCAINE 2% (20 MG/ML) 5 ML SYRINGE
INTRAMUSCULAR | Status: AC
  Filled 2016-10-25: qty 5

## 2016-10-25 MED ORDER — ONDANSETRON HCL 4 MG/2ML IJ SOLN
INTRAMUSCULAR | Status: AC
  Filled 2016-10-25: qty 2

## 2016-10-25 MED ORDER — ACETAMINOPHEN 10 MG/ML IV SOLN
1000.0000 mg | Freq: Four times a day (QID) | INTRAVENOUS | Status: AC
  Administered 2016-10-25 – 2016-10-26 (×4): 1000 mg via INTRAVENOUS
  Filled 2016-10-25 (×4): qty 100

## 2016-10-25 MED ORDER — LACTATED RINGERS IV SOLN
INTRAVENOUS | Status: DC
  Administered 2016-10-25: 12:00:00 via INTRAVENOUS

## 2016-10-25 MED ORDER — SUGAMMADEX SODIUM 500 MG/5ML IV SOLN
INTRAVENOUS | Status: AC
  Filled 2016-10-25: qty 5

## 2016-10-25 MED ORDER — ACETAMINOPHEN 500 MG PO TABS: 1000.0000 mg | ORAL_TABLET | Freq: Four times a day (QID) | ORAL | Status: DC

## 2016-10-25 MED ORDER — ACETAMINOPHEN 160 MG/5ML PO SOLN: 650.0000 mg | ORAL | Status: DC | PRN

## 2016-10-25 MED ORDER — GLYCOPYRROLATE 0.2 MG/ML IV SOSY
PREFILLED_SYRINGE | INTRAVENOUS | Status: AC
  Filled 2016-10-25: qty 3

## 2016-10-25 MED ORDER — LACTATED RINGERS IV SOLN
INTRAVENOUS | Status: DC | PRN
  Administered 2016-10-25 (×2): via INTRAVENOUS

## 2016-10-25 MED ORDER — PROMETHAZINE HCL 25 MG/ML IJ SOLN
INTRAMUSCULAR | Status: AC
  Administered 2016-10-25: 6.25 mg via INTRAVENOUS
  Filled 2016-10-25: qty 1

## 2016-10-25 MED ORDER — PANTOPRAZOLE SODIUM 40 MG IV SOLR
40.0000 mg | Freq: Every day | INTRAVENOUS | Status: DC
  Administered 2016-10-25 – 2016-10-26 (×2): 40 mg via INTRAVENOUS
  Filled 2016-10-25 (×2): qty 40

## 2016-10-25 MED ORDER — HYDROMORPHONE HCL 1 MG/ML IJ SOLN
0.2500 mg | INTRAMUSCULAR | Status: DC | PRN
  Administered 2016-10-25 (×2): 0.5 mg via INTRAVENOUS

## 2016-10-25 MED ORDER — LACTATED RINGERS IR SOLN
Status: DC | PRN
  Administered 2016-10-25: 3000 mL

## 2016-10-25 MED ORDER — CEFOTETAN DISODIUM-DEXTROSE 2-2.08 GM-% IV SOLR
INTRAVENOUS | Status: AC
  Filled 2016-10-25: qty 50

## 2016-10-25 MED ORDER — ONDANSETRON HCL 4 MG/2ML IJ SOLN
INTRAMUSCULAR | Status: DC | PRN
  Administered 2016-10-25: 4 mg via INTRAVENOUS

## 2016-10-25 MED ORDER — SODIUM CHLORIDE 0.9 % IJ SOLN
INTRAMUSCULAR | Status: DC | PRN
  Administered 2016-10-25: 50 mL

## 2016-10-25 MED ORDER — LIDOCAINE HCL (CARDIAC) 20 MG/ML IV SOLN
INTRAVENOUS | Status: DC | PRN
  Administered 2016-10-25: 60 mg via INTRAVENOUS

## 2016-10-25 MED ORDER — ENALAPRILAT 1.25 MG/ML IV SOLN
1.2500 mg | Freq: Four times a day (QID) | INTRAVENOUS | Status: DC | PRN
  Filled 2016-10-25: qty 1

## 2016-10-25 MED ORDER — MIDAZOLAM HCL 5 MG/5ML IJ SOLN
INTRAMUSCULAR | Status: DC | PRN
Start: 2016-10-25 — End: 2016-10-25
  Administered 2016-10-25: 2 mg via INTRAVENOUS

## 2016-10-25 MED ORDER — ACETAMINOPHEN 160 MG/5ML PO SOLN: 325.0000 mg | ORAL | Status: DC | PRN

## 2016-10-25 MED ORDER — HYDROMORPHONE HCL 1 MG/ML IJ SOLN: 0.2500 mg | INTRAMUSCULAR | Status: DC | PRN

## 2016-10-25 MED ORDER — KCL IN DEXTROSE-NACL 20-5-0.45 MEQ/L-%-% IV SOLN
INTRAVENOUS | Status: DC
  Administered 2016-10-25 – 2016-10-26 (×2): via INTRAVENOUS
  Administered 2016-10-26: 1000 mL via INTRAVENOUS
  Administered 2016-10-26 – 2016-10-27 (×2): via INTRAVENOUS
  Filled 2016-10-25 (×6): qty 1000

## 2016-10-25 SURGICAL SUPPLY — 80 items
ADH SKN CLS APL DERMABOND .7 (GAUZE/BANDAGES/DRESSINGS) ×1
APPLIER CLIP ROT 13.4 12 LRG (CLIP) ×3
APR CLP LRG 13.4X12 ROT 20 MLT (CLIP) ×1
BLADE SURG SZ11 CARB STEEL (BLADE) ×3 IMPLANT
CABLE HIGH FREQUENCY MONO STRZ (ELECTRODE) ×2 IMPLANT
CHLORAPREP W/TINT 26ML (MISCELLANEOUS) ×6 IMPLANT
CLIP APPLIE ROT 13.4 12 LRG (CLIP) IMPLANT
CLIP SUT LAPRA TY ABSORB (SUTURE) ×6 IMPLANT
COVER SURGICAL LIGHT HANDLE (MISCELLANEOUS) ×3 IMPLANT
CUTTER FLEX LINEAR 45M (STAPLE) IMPLANT
DERMABOND ADVANCED (GAUZE/BANDAGES/DRESSINGS) ×2
DERMABOND ADVANCED .7 DNX12 (GAUZE/BANDAGES/DRESSINGS) IMPLANT
DEVICE SUT QUICK LOAD TK 5 (STAPLE) IMPLANT
DEVICE SUT TI-KNOT TK 5X26 (MISCELLANEOUS) IMPLANT
DEVICE SUTURE ENDOST 10MM (ENDOMECHANICALS) ×3 IMPLANT
DEVICE TI KNOT TK5 (MISCELLANEOUS)
DRAIN PENROSE 18X1/4 LTX STRL (WOUND CARE) ×3 IMPLANT
ELECT REM PT RETURN 9FT ADLT (ELECTROSURGICAL) ×3
ELECTRODE REM PT RTRN 9FT ADLT (ELECTROSURGICAL) ×1 IMPLANT
GAUZE SPONGE 4X4 12PLY STRL (GAUZE/BANDAGES/DRESSINGS) IMPLANT
GAUZE SPONGE 4X4 16PLY XRAY LF (GAUZE/BANDAGES/DRESSINGS) ×3 IMPLANT
GLOVE BIO SURGEON STRL SZ7.5 (GLOVE) ×2 IMPLANT
GLOVE INDICATOR 8.0 STRL GRN (GLOVE) ×3 IMPLANT
GOWN STRL REUS W/TWL XL LVL3 (GOWN DISPOSABLE) ×12 IMPLANT
HOVERMATT SINGLE USE (MISCELLANEOUS) ×3 IMPLANT
IRRIG SUCT STRYKERFLOW 2 WTIP (MISCELLANEOUS) ×3
IRRIGATION SUCT STRKRFLW 2 WTP (MISCELLANEOUS) ×1 IMPLANT
KIT BASIN OR (CUSTOM PROCEDURE TRAY) ×3 IMPLANT
KIT GASTRIC LAVAGE 34FR ADT (SET/KITS/TRAYS/PACK) ×3 IMPLANT
LUBRICANT JELLY K Y 4OZ (MISCELLANEOUS) ×3 IMPLANT
MARKER SKIN DUAL TIP RULER LAB (MISCELLANEOUS) ×3 IMPLANT
NDL SPNL 22GX3.5 QUINCKE BK (NEEDLE) ×1 IMPLANT
NEEDLE SPNL 22GX3.5 QUINCKE BK (NEEDLE) ×3 IMPLANT
PACK CARDIOVASCULAR III (CUSTOM PROCEDURE TRAY) ×3 IMPLANT
QUICK LOAD TK 5 (STAPLE)
RELOAD 45 VASCULAR/THIN (ENDOMECHANICALS) IMPLANT
RELOAD ENDO STITCH 2.0 (ENDOMECHANICALS) ×33
RELOAD STAPLE 45 2.5 WHT GRN (ENDOMECHANICALS) IMPLANT
RELOAD STAPLE 45 3.5 BLU ETS (ENDOMECHANICALS) IMPLANT
RELOAD STAPLE 60 2.6 WHT THN (STAPLE) ×2 IMPLANT
RELOAD STAPLE 60 3.6 BLU REG (STAPLE) ×2 IMPLANT
RELOAD STAPLE TA45 3.5 REG BLU (ENDOMECHANICALS) IMPLANT
RELOAD STAPLER BLUE 60MM (STAPLE) ×5 IMPLANT
RELOAD STAPLER GOLD 60MM (STAPLE) ×1 IMPLANT
RELOAD STAPLER WHITE 60MM (STAPLE) ×2 IMPLANT
RELOAD SUT SNGL STCH ABSRB 2-0 (ENDOMECHANICALS) ×4 IMPLANT
RELOAD SUT SNGL STCH BLK 2-0 (ENDOMECHANICALS) ×4 IMPLANT
SCISSORS LAP 5X45 EPIX DISP (ENDOMECHANICALS) ×3 IMPLANT
SHEARS HARMONIC ACE PLUS 45CM (MISCELLANEOUS) ×3 IMPLANT
SLEEVE ADV FIXATION 12X100MM (TROCAR) ×6 IMPLANT
SLEEVE XCEL OPT CAN 5 100 (ENDOMECHANICALS) ×2 IMPLANT
SOLUTION ANTI FOG 6CC (MISCELLANEOUS) ×3 IMPLANT
STAPLER ECHELON BIOABSB 60 FLE (MISCELLANEOUS) IMPLANT
STAPLER ECHELON LONG 60 440 (INSTRUMENTS) ×3 IMPLANT
STAPLER RELOAD BLUE 60MM (STAPLE) ×15
STAPLER RELOAD GOLD 60MM (STAPLE) ×3
STAPLER RELOAD WHITE 60MM (STAPLE) ×6
SUT MNCRL AB 4-0 PS2 18 (SUTURE) ×3 IMPLANT
SUT MON AB 4-0 SH 27 (SUTURE) ×2 IMPLANT
SUT RELOAD ENDO STITCH 2 48X1 (ENDOMECHANICALS) ×7
SUT RELOAD ENDO STITCH 2.0 (ENDOMECHANICALS) ×4
SUT SURGIDAC NAB ES-9 0 48 120 (SUTURE) IMPLANT
SUT VIC AB 2-0 SH 27 (SUTURE) ×3
SUT VIC AB 2-0 SH 27X BRD (SUTURE) ×1 IMPLANT
SUTURE RELOAD END STTCH 2 48X1 (ENDOMECHANICALS) ×7 IMPLANT
SUTURE RELOAD ENDO STITCH 2.0 (ENDOMECHANICALS) ×4 IMPLANT
SYR 10ML ECCENTRIC (SYRINGE) ×3 IMPLANT
SYR 20CC LL (SYRINGE) ×6 IMPLANT
TIP RIGID 35CM EVICEL (HEMOSTASIS) ×3 IMPLANT
TOWEL OR 17X26 10 PK STRL BLUE (TOWEL DISPOSABLE) ×3 IMPLANT
TOWEL OR NON WOVEN STRL DISP B (DISPOSABLE) ×3 IMPLANT
TRAY FOLEY CATH 14FRSI W/METER (CATHETERS) ×2 IMPLANT
TROCAR ADV FIXATION 12X100MM (TROCAR) ×3 IMPLANT
TROCAR ADV FIXATION 5X100MM (TROCAR) ×3 IMPLANT
TROCAR BLADELESS OPT 5 100 (ENDOMECHANICALS) ×3 IMPLANT
TROCAR XCEL 12X100 BLDLESS (ENDOMECHANICALS) ×3 IMPLANT
TUBING CONNECTING 10 (TUBING) ×2 IMPLANT
TUBING CONNECTING 10' (TUBING) ×2
TUBING ENDO SMARTCAP PENTAX (MISCELLANEOUS) ×3 IMPLANT
TUBING INSUF HEATED (TUBING) ×3 IMPLANT

## 2016-10-25 NOTE — H&P (View-Only) (Signed)
Raylene Everts 10/20/2016 3:08 PM Location: Standard Office Patient #: [phone] DOB: [birth year] Divorced / Language: Cleophus Molt / Race: White Female  History of Present Illness Randall Hiss M. Ambry Dix MD; 10/20/2016 3:47 PM) Patient words: preop.  The patient is a 55 year old female who presents for a pre-op visit. she comes in today for her preoperative visit. She has been scheduled and approved for laparoscopic Roux-en-Y gastric bypass. I initially met her in March 2017. Her weight at that time was 311 pounds. She denies any changes since she was initially seen. She finally agreed to see a pulmonologist to rediscuss CPAP therapy for her positive sleep study. She has been on CPAP for a few weeks. She has a nasal mask. But she complains that it dries out her nose. She has a follow-up appointment with the pulmonologist this coming Monday. Otherwise she denies any medical changes. She denies any chest pain, chest pressure, shortness of breath, dyspnea on exertion, reflux, melena, hematochezia, dysuria, hematuria.  Her upper GI was unremarkable. Her evaluation labs showed a triglyceride level of 155, HDL level was low at 43, she has a diagnosis of subclinical hypothyroidism and her TSH was 5.61. Her hemoglobin A1c was 6.4.   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 Hysterectomy (not due to cancer) - Complete Oral Surgery Cesarean Section - 1  Diagnostic Studies History Gayland Curry, MD; 10/20/2016 3:48 PM) Colonoscopy  within last year Mammogram within last year Pap Smear >5 years ago  Allergies Aleatha Borer, LPN; 78/29/5621 3:08 PM) No Known Drug Allergies 02/10/2016  Medication History Gayland Curry, MD; 10/20/2016 3:48 PM) Lisinopril-Hydrochlorothiazide (10-12.5MG Tablet, Oral) Active. Medications Reconciled Pantoprazole Sodium (40MG Tablet DR, 1 (one) Tablet Oral daily, Taken starting 10/20/2016) Active. Ondansetron (4MG Tablet Disint, 1 (one) Tablet Oral every six hours, as needed, Taken starting 10/20/2016) Active. OxyCODONE HCl (5MG/5ML Solution, 5-10 Milliliter Oral every four hours, as needed, Taken starting 10/20/2016) Active.  Social History Gayland Curry, MD; 10/20/2016 3:48 PM) Tobacco use Former smoker. Alcohol use Occasional alcohol use. No drug use Caffeine use Tea.  Family History Gayland Curry, MD; 10/20/2016 3:48 PM) Colon Cancer Sister. Malignant Neoplasm Of Pancreas Father. Hypertension Mother. Diabetes Mellitus Father, Mother, Sister. Heart Disease Brother, Father, Mother. Heart disease in female family member before age 37  Pregnancy / Birth History Gayland Curry, MD; 10/20/2016 3:48 PM) Age at menarche 63 years. Gravida 4 Contraceptive History Oral contraceptives. Para 5 Maternal age 50-20     Review of Systems Randall Hiss M. Farhaan Mabee MD; 10/20/2016 3:44 PM) General Not Present- Appetite Loss, Chills, Fatigue, Fever, Night Sweats, Weight Gain and Weight Loss. Skin Present- Dryness. Not Present- Change in Wart/Mole, Hives, Jaundice, New Lesions, Non-Healing Wounds, Rash and Ulcer. HEENT Present- Wears glasses/contact lenses. Not Present- Earache, Hearing Loss, Hoarseness, Nose Bleed, Oral Ulcers, Ringing in the Ears, Seasonal Allergies, Sinus Pain, Sore Throat, Visual Disturbances and Yellow Eyes. Respiratory Not Present- Bloody sputum, Chronic Cough, Difficulty Breathing, Snoring and Wheezing. Breast Not Present- Breast Mass, Breast Pain, Nipple  Discharge and Skin Changes. Cardiovascular Not Present- Chest Pain, Difficulty Breathing Lying Down, Leg Cramps, Palpitations, Rapid Heart  Rate, Shortness of Breath and Swelling of Extremities. Gastrointestinal Present- Hemorrhoids. Not Present- Abdominal Pain, Bloating, Bloody Stool, Change in Bowel Habits, Chronic diarrhea, Constipation, Difficulty Swallowing, Excessive gas, Gets full quickly at meals, Indigestion, Nausea, Rectal Pain and Vomiting. Female Genitourinary Present- Urgency. Not Present- Frequency, Nocturia, Painful Urination and Pelvic Pain. Musculoskeletal Present- Back Pain. Not Present- Joint Pain, Joint Stiffness, Muscle Pain, Muscle Weakness and Swelling of Extremities. Neurological Not Present- Decreased Memory, Fainting, Headaches, Numbness, Seizures, Tingling, Tremor, Trouble walking and Weakness. Psychiatric Not Present- Anxiety, Bipolar, Change in Sleep Pattern, Depression, Fearful and Frequent crying. Endocrine Not Present- Cold Intolerance, Excessive Hunger, Hair Changes, Heat Intolerance, Hot flashes and New Diabetes. Hematology Not Present- Easy Bruising, Excessive bleeding, Gland problems, HIV and Persistent Infections.  Vitals (Ammie Eversole LPN; 93/71/6967 8:93 PM) 10/20/2016 3:17 PM Weight: 309.4 lb Height: 68in Body Surface Area: 2.46 m Body Mass Index: 47.04 kg/m  Temp.: 98.71F(Oral)  Pulse: 86 (Regular)  BP: 118/78 (Sitting, Left Arm, Standard)      Physical Exam Randall Hiss M. Karder Goodin MD; 10/20/2016 3:44 PM)  General Mental Status-Alert. General Appearance-Consistent with stated age. Hydration-Well hydrated. Voice-Normal. Note: morbidly obese, evenly distributed  Head and Neck Head-normocephalic, atraumatic with no lesions or palpable masses. Trachea-midline. Thyroid Gland Characteristics - normal size and consistency.  Eye Eyeball - Bilateral-Extraocular movements intact. Sclera/Conjunctiva - Bilateral-No scleral  icterus.  Chest and Lung Exam Chest and lung exam reveals -quiet, even and easy respiratory effort with no use of accessory muscles and on auscultation, normal breath sounds, no adventitious sounds and normal vocal resonance. Inspection Chest Wall - Normal. Back - normal.  Breast - Did not examine.  Cardiovascular Cardiovascular examination reveals -normal heart sounds, regular rate and rhythm with no murmurs and normal pedal pulses bilaterally.  Abdomen Inspection Inspection of the abdomen reveals - No Hernias. Skin - Scar - no surgical scars. Palpation/Percussion Palpation and Percussion of the abdomen reveal - Soft, Non Tender, No Rebound tenderness, No Rigidity (guarding) and No hepatosplenomegaly. Auscultation Auscultation of the abdomen reveals - Bowel sounds normal.  Peripheral Vascular Upper Extremity Palpation - Pulses bilaterally normal.  Neurologic Neurologic evaluation reveals -alert and oriented x 3 with no impairment of recent or remote memory. Mental Status-Normal.  Neuropsychiatric The patient's mood and affect are described as -normal. Judgment and Insight-insight is appropriate concerning matters relevant to self.  Musculoskeletal Normal Exam - Left-Upper Extremity Strength Normal and Lower Extremity Strength Normal. Normal Exam - Right-Upper Extremity Strength Normal and Lower Extremity Strength Normal. Note: b/l knee crepitus  Lymphatic Head & Neck  General Head & Neck Lymphatics: Bilateral - Description - Normal. Axillary - Did not examine. Femoral & Inguinal - Did not examine.    Assessment & Plan Randall Hiss M. Taleigha Pinson MD; 10/20/2016 3:44 PM)  MORBID OBESITY WITH BMI OF 45.0-49.9, ADULT (E66.01) Impression: wwe reviewed her preoperative workup. We discussed the results of her labs and upper GI. We disscussed the typical recovery course. We also discussed the typical hospital course. She was given her postoperative pain, heartburn, and  nausea prescriptions today. We discussed the importance of the preoperative bowel prep. we also discussed the preoperative diet plan.  Current Plans Started OxyCODONE HCl 5MG/5ML, 5-10 Milliliter every four hours, as needed, 150 Milliliter, 10/20/2016, No Refill. Started Pantoprazole Sodium 40MG, 1 (one) Tablet daily, #30, 30 days starting 10/20/2016, Ref. x3. Started Ondansetron 4MG, 1 (one) Tablet every six hours, as needed, #15, 10/20/2016, No Refill. Pt Education - EMW_preopbariatric PREDIABETES (R73.03)  OSTEOARTHRITIS OF RIGHT KNEE,  UNSPECIFIED OSTEOARTHRITIS TYPE (M17.11)  OBSTRUCTIVE SLEEP APNEA (G47.33) Impression: wwe discussed that often times there are issues when first going on CPAP. She has a follow-up appointment with the pulmonologist is coming Monday.  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 Redmond Pulling, MD, FACS General, Bariatric, & Minimally Invasive Surgery [facility], [place]

## 2016-10-25 NOTE — Interval H&P Note (Signed)
History and Physical Interval Note:  10/25/2016 7:13 AM  Haley Higgins  has presented today for surgery, with the diagnosis of MORBID OBESITY  The various methods of treatment have been discussed with the patient and family. After consideration of risks, benefits and other options for treatment, the patient has consented to  Procedure(s): LAPAROSCOPIC ROUX-EN-Y GASTRIC BYPASS WITH UPPER ENDOSCOPY (N/A) as a surgical intervention .  The patient's history has been reviewed, patient examined, no change in status, stable for surgery.  I have reviewed the patient's chart and labs.  Questions were answered to the patient's satisfaction.  Mary SellaEric M. Andrey CampanileWilson, MD, FACS General, Bariatric, & Minimally Invasive Surgery [facility], [place]     [facility] HospitalWILSON,Colsen Modi M

## 2016-10-25 NOTE — Anesthesia Procedure Notes (Signed)
Procedure Name: Intubation Date/Time: 10/25/2016 7:26 AM Performed by: Thornell MuleSTUBBLEFIELD, Miniya Miguez G Pre-anesthesia Checklist: Patient identified, Emergency Drugs available, Suction available and Patient being monitored Patient Re-evaluated:Patient Re-evaluated prior to inductionOxygen Delivery Method: Circle system utilized Preoxygenation: Pre-oxygenation with 100% oxygen Intubation Type: IV induction Ventilation: Mask ventilation without difficulty Laryngoscope Size: 3 and Miller Grade View: Grade I Tube type: Oral Tube size: 7.0 mm Number of attempts: 1 Airway Equipment and Method: Stylet and Oral airway Placement Confirmation: ETT inserted through vocal cords under direct vision,  positive ETCO2 and breath sounds checked- equal and bilateral Secured at: 21 cm Tube secured with: Tape Dental Injury: Teeth and Oropharynx as per pre-operative assessment

## 2016-10-25 NOTE — Op Note (Signed)
Name:  Haley Higgins Melamed MRN: [id number] Date of Surgery: 10/25/2016  Preop Diagnosis:  Morbid Obesity, S/P RYGB  Postop Diagnosis:  Morbid Obesity, S/P RYGB (Weight - 309, BMI - 47)  Procedure:  Upper endoscopy  (Intraoperative)  Surgeon:  Ovidio Kinavid Jeromey Kruer, M.D.  Anesthesia:  GET  Indications for procedure: Haley Higgins is a 55 y.o. female whose primary care physician is Everrett CoombeMatthews, [place], DO and has completed a Roux-en-Y gastric bypass today by Dr. Andrey CampanileWilson.  I am doing an intraoperative upper endoscopy to evaluate the gastric pouch and the gastro-jejunal anastomosis.  Operative Note: The patient is under general anesthesia.  Dr. Andrey CampanileWilson is laparoscoping the patient while I do an upper endoscopy to evaluate the stomach pouch and gastrojejunal anastomosis.  With the patient intubated, I passed the Pentax endoscope without difficulty down the esophagus.  The esophago-gastric junction was at 39 cm.  The gastro-jejunal anastomosis was at 44 cm.  The mucosa of the stomach looked viable and the staple line was intact without bleeding.  The gastro-jejunal anastomosis looked okay.  While I insufflated the stomach pouch with air, Dr. Andrey CampanileWilson clamped off the efferent limb of the jejunum.  He then flooded the upper abdomen with saline to put the gastric pouch and gastro-jejunal anastomosis under saline.  There was no bubbling or evidence of a leak.    The scope was then withdrawn.  The esophagus was unremarkable and the patient tolerated the endoscopy without difficulty.  Ovidio Kinavid Senan Urey, MD, [facility] Pager: [phone] Office phone:  [phone]

## 2016-10-25 NOTE — Op Note (Signed)
Haley LoaSusan Higgins [id number] May 09, 1961. 10/25/2016  Preoperative diagnosis:    Obesity, Class III, BMI 45   OSA on CPAP   Osteoarthritis of right knee   Lumbar and sacral osteoarthritis   Prediabetes   Low HDL (under 40)  Postoperative  diagnosis:  1. same  Surgical procedure: Laparoscopic Roux-en-Y gastric bypass (ante-colic, ante-gastric); upper endoscopy  Surgeon: Atilano InaEric M Jimesha Rising, M.D. FACS  Asst.: Ovidio Kinavid Newman MD FACS  Anesthesia: General plus 70cc exparel  Complications: None   EBL: Minimal   Drains: None   Disposition: PACU in good condition   Indications for procedure: 55yo wf with morbid obesity who has been unsuccessful at sustained weight loss. The patient's comorbidities are listed above. We discussed the risk and benefits of surgery including but not limited to anesthesia risk, bleeding, infection, blood clot formation, anastomotic leak, anastomotic stricture, ulcer formation, death, respiratory complications, intestinal blockage, internal hernia, gallstone formation, vitamin and nutritional deficiencies, injury to surrounding structures, failure to lose weight and mood changes.   Description of procedure: Patient is brought to the operating room and general anesthesia induced. The patient had received preoperative broad-spectrum IV antibiotics and subcutaneous heparin. The abdomen was widely sterilely prepped with Chloraprep and draped. Patient timeout was performed and correct patient and procedure confirmed. Access was obtained with a 12 mm Optiview trocar in the left upper quadrant and pneumoperitoneum established without difficulty. Under direct vision 12 mm trocars were placed laterally in the right upper quadrant, right upper quadrant midclavicular line, and to the left and above the umbilicus for the camera port. A 5 mm trocar was placed laterally in the left upper quadrant. exparel was infiltrated in the bilateral lateral upper abdominal wall as a tap block.  The  omentum was brought into the upper abdomen and the transverse mesocolon elevated and the ligament of Treitz clearly identified. A 40 cm biliopancreatic limb was then carefully measured from the ligament of Treitz. The small intestine was divided at this point with a single firing of the white load linear stapler. A Penrose drain was sutured to the end of the Roux-en-Y limb for later identification. A 100 cm Roux-en-Y limb was then carefully measured. At this point a side-to-side anastomosis was created between the Roux limb and the end of the biliopancreatic limb. This was accomplished with a single firing of the 60 mm white load linear stapler. The common enterotomy was closed with a running 2-0 Vicryl begun at either end of the enterotomy and tied centrally. One additional interrupted 2-0 Vicryl suture was placed at one and of the closure for additional reinforcement. Eviceal tissue sealant was placed over the anastomosis. The mesenteric defect was then closed with running 2-0 silk. The omentum was then divided with the harmonic scalpel up towards the transverse colon to allow mobility of the Roux limb toward the gastric pouch. The patient was then placed in steep reversed Trendelenburg. Through a 5 mm subxiphoid site the [facility] retractor was placed and the left lobe of the liver elevated with excellent exposure of the upper stomach and hiatus. The angle of Hiss was then mobilized with the harmonic scalpel. A 4 cm gastric pouch was then carefully measured along the lesser curve of the stomach. Dissection was carried along the lesser curve at this point with the Harmonic scalpel working carefully back toward the lesser sac at right angles to the lesser curve. The free lesser sac was then entered. After being sure all tubes were removed from the stomach an initial firing of the  gold load 60 mm linear stapler was fired at right angles across the lesser curve for about 4 cm. The gastric pouch was further mobilized  posteriorly and then the pouch was completed with 3 further firings of the 60 mm blue load linear stapler stapler up through the previously dissected angle of His. It was ensured that the pouch was completely mobilized away from the gastric remnant. This created a nice tubular 4-5 cm gastric pouch.  The Roux limb was then brought up in an antecolic fashion with the candycane facing to the patient's left without undue tension. The gastrojejunostomy was created with an initial posterior row of 2-0 Vicryl between the Roux limb and the staple line of the gastric pouch. Enterotomies were then made in the gastric pouch and the Roux limb with the harmonic scalpel and at approximately 2-2-1/2 cm anastomosis was created with a single firing of the 60mm blue load linear stapler to create a 2-1/2 cm anastomosis. The staple line was inspected and was intact without bleeding. The common enterotomy was then closed with running 2-0 Vicryl begun at either end and tied centrally. The Ewall tube was then easily passed through the anastomosis and an outer anterior layer of running 2-0 Vicryl was placed. The Ewald tube was removed. With the outlet of the gastrojejunostomy clamped and under saline irrigation the assistant performed upper endoscopy and with the gastric pouch tensely distended with air-there was no evidence of leak on this test. The pouch was desufflated. The Vonita Mosseterson defect was closed with running 2-0 silk. The abdomen was inspected for any evidence of bleeding or bowel injury and everything looked fine. The Nathanson retractor was removed under direct vision after coating the anastomosis with Eviceal tissue sealant. All CO2 was evacuated and trochars removed. Skin incisions were closed with 4-0 monocryl in a subcuticular fashion followed by Dermabond. Sponge needle and instrument counts were correct. The patient was taken to the PACU in good condition.    Mary SellaEric M. Andrey CampanileWilson, MD, FACS General, Bariatric, & Minimally  Invasive Surgery [facility], [place]

## 2016-10-25 NOTE — Transfer of Care (Signed)
Immediate Anesthesia Transfer of Care Note  Patient: Haley Higgins  Procedure(s) Performed: Procedure(s): LAPAROSCOPIC ROUX-EN-Y GASTRIC BYPASS WITH UPPER ENDOSCOPY (N/A)  Patient Location: PACU  Anesthesia Type:General  Level of Consciousness: awake, alert  and oriented  Airway & Oxygen Therapy: Patient Spontanous Breathing and Patient connected to face mask oxygen  Post-op Assessment: Report given to RN and Post -op Vital signs reviewed and stable  Post vital signs: Reviewed and stable  Last Vitals:  Vitals:   10/25/16 0511  BP: 102/73  Pulse: 88  Resp: 16  Temp: 36.4 C    Last Pain:  Vitals:   10/25/16 0511  TempSrc: Oral         Complications: No apparent anesthesia complications

## 2016-10-25 NOTE — Anesthesia Postprocedure Evaluation (Signed)
Anesthesia Post Note  Patient: Haley Higgins  Procedure(s) Performed: Procedure(s) (LRB): LAPAROSCOPIC ROUX-EN-Y GASTRIC BYPASS WITH UPPER ENDOSCOPY (N/A)  Patient location during evaluation: PACU Anesthesia Type: General Level of consciousness: awake Pain management: pain level controlled Vital Signs Assessment: post-procedure vital signs reviewed and stable Respiratory status: spontaneous breathing Cardiovascular status: stable Anesthetic complications: no    Last Vitals:  Vitals:   10/25/16 1215 10/25/16 1259  BP: 125/81 (!) 146/92  Pulse: 78 78  Resp: 17 14  Temp: 36.4 C 36.4 C    Last Pain:  Vitals:   10/25/16 1215  TempSrc:   PainSc: Asleep                 EDWARDS,Nithila Sumners

## 2016-10-25 NOTE — Anesthesia Preprocedure Evaluation (Addendum)
Anesthesia Evaluation  Patient identified by MRN, date of birth, ID band Patient awake    Reviewed: Allergy & Precautions  History of Anesthesia Complications (+) PONV  Airway Mallampati: II  TM Distance: >3 FB     Dental   Pulmonary sleep apnea , former smoker,    breath sounds clear to auscultation       Cardiovascular hypertension,  Rhythm:Regular Rate:Normal     Neuro/Psych    GI/Hepatic Neg liver ROS, History noted. CE   Endo/Other  negative endocrine ROS  Renal/GU negative Renal ROS     Musculoskeletal   Abdominal   Peds  Hematology   Anesthesia Other Findings   Reproductive/Obstetrics                             Anesthesia Physical Anesthesia Plan  ASA: III  Anesthesia Plan: General   Post-op Pain Management:    Induction: Intravenous  Airway Management Planned:   Additional Equipment:   Intra-op Plan:   Post-operative Plan: Possible Post-op intubation/ventilation  Informed Consent: I have reviewed the patients History and Physical, chart, labs and discussed the procedure including the risks, benefits and alternatives for the proposed anesthesia with the patient or authorized representative who has indicated his/her understanding and acceptance.   Dental advisory given  Plan Discussed with: CRNA and Anesthesiologist  Anesthesia Plan Comments:         Anesthesia Quick Evaluation

## 2016-10-26 LAB — CBC WITH DIFFERENTIAL/PLATELET
Basophils Absolute: 0 10*3/uL (ref 0.0–0.1)
Basophils Relative: 0 %
Eosinophils Absolute: 0 10*3/uL (ref 0.0–0.7)
Eosinophils Relative: 0 %
HEMATOCRIT: 41.4 % (ref 36.0–46.0)
HEMOGLOBIN: 13.4 g/dL (ref 12.0–15.0)
LYMPHS ABS: 2.4 10*3/uL (ref 0.7–4.0)
LYMPHS PCT: 20 %
MCH: 27.8 pg (ref 26.0–34.0)
MCHC: 32.4 g/dL (ref 30.0–36.0)
MCV: 85.9 fL (ref 78.0–100.0)
MONOS PCT: 8 %
Monocytes Absolute: 0.9 10*3/uL (ref 0.1–1.0)
NEUTROS ABS: 8.7 10*3/uL — AB (ref 1.7–7.7)
NEUTROS PCT: 72 %
Platelets: 296 10*3/uL (ref 150–400)
RBC: 4.82 MIL/uL (ref 3.87–5.11)
RDW: 14 % (ref 11.5–15.5)
WBC: 12 10*3/uL — ABNORMAL HIGH (ref 4.0–10.5)

## 2016-10-26 LAB — COMPREHENSIVE METABOLIC PANEL
ALK PHOS: 60 U/L (ref 38–126)
ALT: 56 U/L — ABNORMAL HIGH (ref 14–54)
ANION GAP: 8 (ref 5–15)
AST: 59 U/L — ABNORMAL HIGH (ref 15–41)
Albumin: 3.6 g/dL (ref 3.5–5.0)
BILIRUBIN TOTAL: 0.5 mg/dL (ref 0.3–1.2)
BUN: 10 mg/dL (ref 6–20)
CALCIUM: 8.7 mg/dL — AB (ref 8.9–10.3)
CO2: 26 mmol/L (ref 22–32)
CREATININE: 0.85 mg/dL (ref 0.44–1.00)
Chloride: 104 mmol/L (ref 101–111)
GFR calc non Af Amer: >60 mL/min (ref >60)
Glucose, Bld: 153 mg/dL — ABNORMAL HIGH (ref 65–99)
Potassium: 4.5 mmol/L (ref 3.5–5.1)
Sodium: 138 mmol/L (ref 135–145)
TOTAL PROTEIN: 6.2 g/dL — AB (ref 6.5–8.1)

## 2016-10-26 LAB — HEMOGLOBIN AND HEMATOCRIT, BLOOD
HEMATOCRIT: 41.4 % (ref 36.0–46.0)
Hemoglobin: 13.8 g/dL (ref 12.0–15.0)

## 2016-10-26 NOTE — Progress Notes (Signed)
Patient alert and oriented, Post op day 1.  Provided support and encouragement.  Encouraged pulmonary toilet, ambulation and small sips of liquids.  All questions answered.  Will continue to monitor. 

## 2016-10-26 NOTE — Discharge Instructions (Signed)

## 2016-10-26 NOTE — Progress Notes (Signed)
Patient ID: Haley Higgins, female   DOB: Aug 06, 1961, 55 y.o.   MRN: [id number]  Progress Note: Metabolic and Bariatric Surgery Service   Subjective: Doing well. Not much nausea or abd pain. Ambulated. Doing IS. Ice went well  Objective: Vital signs in last 24 hours: Temp:  [97.6 F (36.4 C)-98.6 F (37 C)] 98.1 F (36.7 C) (12/06 1015) Pulse Rate:  [65-85] 66 (12/06 1015) Resp:  [14-22] 18 (12/06 1015) BP: (98-146)/(49-92) 107/63 (12/06 1015) SpO2:  [95 %-100 %] 99 % (12/06 1015) Last BM Date: 10/24/16 (bowel prep)  Intake/Output from previous day: 12/05 0701 - 12/06 0700 In: 3779.2 [I.V.:3779.2] Out: 2125 [Urine:2075; Blood:50] Intake/Output this shift: No intake/output data recorded.  Lungs: cta  Cardiovascular: reg  Abd: soft, incisions c/d/i, nd, obese, very mild approp TTP  Extremities: no edema, +SCDs  Neuro: alert, nonfocal, nontoxic  Lab Results: CBC   Recent Labs  10/25/16 1109 10/26/16 0437  WBC  --  12.0*  HGB 14.2 13.4  HCT 43.8 41.4  PLT  --  296   BMET  Recent Labs  10/26/16 0437  NA 138  K 4.5  CL 104  CO2 26  GLUCOSE 153*  BUN 10  CREATININE 0.85  CALCIUM 8.7*   PT/INR No results for input(s): LABPROT, INR in the last 72 hours. ABG No results for input(s): PHART, HCO3 in the last 72 hours.  Invalid input(s): PCO2, PO2  Studies/Results:  Anti-infectives: Anti-infectives    Start     Dose/Rate Route Frequency Ordered Stop   10/25/16 0511  cefoTEtan in Dextrose 5% (CEFOTAN) IVPB 2 g     2 g Intravenous On call to O.R. 10/25/16 0511 10/25/16 [id number]      Medications: Scheduled Meds: . acetaminophen  1,000 mg Intravenous Q6H  . enoxaparin (LOVENOX) injection  30 mg Subcutaneous Q12H  . pantoprazole (PROTONIX) IV  40 mg Intravenous QHS  . [START ON 10/27/2016] protein supplement shake  2 oz Oral Q2H   Continuous Infusions: . dextrose 5 % and 0.45 % NaCl with KCl 20 mEq/L 125 mL/hr at 10/26/16 1057   PRN Meds:.oxyCODONE  **AND** acetaminophen, acetaminophen (TYLENOL) oral liquid 160 mg/5 mL, diphenhydrAMINE, enalaprilat, morphine injection, ondansetron (ZOFRAN) IV, promethazine  Assessment/Plan: Patient Active Problem List   Diagnosis Date Noted  . S/P gastric bypass 10/25/2016  . Obesity, Class III, BMI 40-49.9 (morbid obesity) (HCC) 10/25/2016  . OSA on CPAP 10/25/2016  . Osteoarthritis of right knee 10/25/2016  . Lumbar and sacral osteoarthritis 10/25/2016  . Prediabetes 10/25/2016  . Low HDL (under 40) 10/25/2016   s/p Procedure(s): LAPAROSCOPIC ROUX-EN-Y GASTRIC BYPASS WITH UPPER ENDOSCOPY 10/25/2016  Principal Problem:   Obesity, Class III, BMI 40-49.9 (morbid obesity) (HCC) Active Problems:   S/P gastric bypass   OSA on CPAP   Osteoarthritis of right knee   Lumbar and sacral osteoarthritis   Prediabetes   Low HDL (under 40)  Doing well. No fever. No tachycardia. Small bump in wbc but o/w all other indicators are normal. Will cont POD 1 diet Cont chemical vte prophylaxis Ambulate, pulm toilet  Disposition:  LOS: 1 day  The patient will be in the hospital for normal postop protocol  Atilano InaWILSON,Jarry Manon M, MD [phone] [facility]

## 2016-10-26 NOTE — Plan of Care (Signed)
Problem: Food- and Nutrition-Related Knowledge Deficit (NB-1.1) Goal: Nutrition education Formal process to instruct or train a patient/client in a skill or to impart knowledge to help patients/clients voluntarily manage or modify food choices and eating behavior to maintain or improve health. Outcome: Completed/Met Date Met: 10/26/16 Nutrition Education Note  Received consult for diet education per DROP protocol.   Discussed 2 week post op diet with pt. Emphasized that liquids must be non carbonated, non caffeinated, and sugar free. Fluid goals discussed. Reviewed progression of diet to include soft proteins at 7-10 days post-op. Pt to follow up with outpatient bariatric RD for further diet progression after 2 weeks. Multivitamins and minerals also reviewed. Teach back method used, pt expressed understanding, expect good compliance.   Diet: First 2 Weeks  You will see the dietitian about two (2) weeks after your surgery. The dietitian will increase the types of foods you can eat if you are handling liquids well:  If you have severe vomiting or nausea and cannot handle clear liquids lasting longer than 1 day, call your surgeon  Protein Shake  Drink at least 2 ounces of shake 5-6 times per day  Each serving of protein shakes (usually 8 - 12 ounces) should have a minimum of:  15 grams of protein  And no more than 5 grams of carbohydrate  Goal for protein each day:  Men = 80 grams per day  Women = 60 grams per day  Protein powder may be added to fluids such as non-fat milk or Lactaid milk or Soy milk (limit to 35 grams added protein powder per serving)   Hydration  Slowly increase the amount of water and other clear liquids as tolerated (See Acceptable Fluids)  Slowly increase the amount of protein shake as tolerated  Sip fluids slowly and throughout the day  May use sugar substitutes in small amounts (no more than 6 - 8 packets per day; i.e. Splenda)   Fluid Goal  The first goal is to  drink at least 8 ounces of protein shake/drink per day (or as directed by the nutritionist); some examples of protein shakes are Syntrax Nectar, Adkins Advantage, EAS Edge HP, and Unjury. See handout from pre-op Bariatric Education Class:  Slowly increase the amount of protein shake you drink as tolerated  You may find it easier to slowly sip shakes throughout the day  It is important to get your proteins in first  Your fluid goal is to drink 64 - 100 ounces of fluid daily  It may take a few weeks to build up to this  32 oz (or more) should be clear liquids  And  32 oz (or more) should be full liquids (see below for examples)  Liquids should not contain sugar, caffeine, or carbonation   Clear Liquids:  Water or Sugar-free flavored water (i.e. Fruit H2O, Propel)  Decaffeinated coffee or tea (sugar-free)  Crystal Lite, Wyler's Lite, Minute Maid Lite  Sugar-free Jell-O  Bouillon or broth  Sugar-free Popsicle: *Less than 20 calories each; Limit 1 per day   Full Liquids:  Protein Shakes/Drinks + 2 choices per day of other full liquids  Full liquids must be:  No More Than 12 grams of Carbs per serving  No More Than 3 grams of Fat per serving  Strained low-fat cream soup  Non-Fat milk  Fat-free Lactaid Milk  Sugar-free yogurt (Dannon Lite & Fit, Greek yogurt)     Layton Naves, MS, RD, LDN Pager: [phone] After Hours Pager: [phone]    

## 2016-10-26 NOTE — Progress Notes (Signed)
Pt. States she can place herself on cpap when she is ready.

## 2016-10-27 LAB — CBC WITH DIFFERENTIAL/PLATELET
BASOS PCT: 1 %
Basophils Absolute: 0.1 10*3/uL (ref 0.0–0.1)
Eosinophils Absolute: 0.1 10*3/uL (ref 0.0–0.7)
Eosinophils Relative: 1 %
HEMATOCRIT: 38.7 % (ref 36.0–46.0)
HEMOGLOBIN: 12.5 g/dL (ref 12.0–15.0)
LYMPHS ABS: 4.1 10*3/uL — AB (ref 0.7–4.0)
LYMPHS PCT: 42 %
MCH: 28.2 pg (ref 26.0–34.0)
MCHC: 32.3 g/dL (ref 30.0–36.0)
MCV: 87.2 fL (ref 78.0–100.0)
MONO ABS: 0.6 10*3/uL (ref 0.1–1.0)
MONOS PCT: 6 %
NEUTROS ABS: 5 10*3/uL (ref 1.7–7.7)
NEUTROS PCT: 50 %
Platelets: 238 10*3/uL (ref 150–400)
RBC: 4.44 MIL/uL (ref 3.87–5.11)
RDW: 14.3 % (ref 11.5–15.5)
WBC: 9.8 10*3/uL (ref 4.0–10.5)

## 2016-10-27 MED ORDER — OXYCODONE HCL 5 MG/5ML PO SOLN: 5.0000 mg | ORAL | 0 refills | Status: AC | PRN

## 2016-10-27 NOTE — Progress Notes (Signed)
Patient alert and oriented, pain is controlled. Patient is tolerating fluids, advanced to protein shake today, patient is tolerating well. Reviewed Gastric Bypass discharge instructions with patient and patient is able to articulate understanding. Provided information on BELT program, Support Group and [facility] outpatient pharmacy. All questions answered, will continue to monitor.    

## 2016-10-27 NOTE — Discharge Summary (Signed)
Physician Discharge Summary  Haley Higgins WUJ:[id number] DOB: [birth year] DOA: 10/25/2016  PCP: Everrett Coombe, DO  Admit date: 10/25/2016 Discharge date: 10/27/2016  Recommendations for Outpatient Follow-up:  1.   Follow-up Information    Atilano Ina, MD. Go on 11/09/2016.   Specialty:  General Surgery Why:  at 9:45 AM for post-op Contact information: [address] [place] [address] [phone]        Atilano Ina, MD Follow up.   Specialty:  General Surgery Contact information: [address] [place] [address] [phone]          Discharge Diagnoses:  Principal Problem:   Obesity, Class III, BMI 40-49.9 (morbid obesity) (HCC) Active Problems:   S/P gastric bypass   OSA on CPAP   Osteoarthritis of right knee   Lumbar and sacral osteoarthritis   Prediabetes   Low HDL (under 40)   Surgical Procedure: Laparoscopic Roux-en-Y gastric bypass, upper endoscopy  Discharge Condition: Good Disposition: Home  Diet recommendation: Postoperative gastric bypass diet  Filed Weights   10/25/16 0511  Weight: (!) 138.4 kg (305 lb 3.2 oz)     Hospital Course:  The patient was admitted for a planned laparoscopic Roux-en-Y gastric bypass. Please see operative note. Preoperatively the patient was given 5000 units of subcutaneous heparin for DVT prophylaxis. Postoperative prophylactic Lovenox dosing was started on the morning of postoperative day 1. On postoperative day one the patient had no fever, tachycardia and looked well. The patient was started on ice chips and water which they tolerated. On postoperative day 2 The patient's diet was advanced to protein shakes which they also tolerated. The patient was ambulating without difficulty. Their vital signs are stable without fever or tachycardia. Their hemoglobin had remained stable. The patient was maintained on their home settings for CPAP therapy. The patient had received discharge instructions  and counseling. They were deemed stable for discharge.  She had no complaints this morning.  She was tolerating liquids.  She was ambulating without difficulty.  The gas discomfort had resolved.  BP 101/64 (BP Location: Right Arm)   Pulse 69   Temp 98.7 F (37.1 C) (Oral)   Resp 18   Ht 5\' 9"  (1.753 m)   Wt (!) 138.4 kg (305 lb 3.2 oz)   SpO2 95%   BMI 45.07 kg/m   Gen: alert, NAD, non-toxic appearing Pupils: equal, no scleral icterus Pulm: Lungs clear to auscultation, symmetric chest rise CV: regular rate and rhythm Abd: soft, nontender, nondistended.  No cellulitis. No incisional hernia Ext: no edema, no calf tenderness Skin: no rash, no jaundice    Discharge Instructions  Discharge Instructions    Ambulate hourly while awake    Complete by:  As directed    Call MD for:  difficulty breathing, headache or visual disturbances    Complete by:  As directed    Call MD for:  persistant dizziness or light-headedness    Complete by:  As directed    Call MD for:  persistant nausea and vomiting    Complete by:  As directed    Call MD for:  redness, tenderness, or signs of infection (pain, swelling, redness, odor or green/yellow discharge around incision site)    Complete by:  As directed    Call MD for:  severe uncontrolled pain    Complete by:  As directed    Call MD for:  temperature >101 F    Complete by:  As directed  Diet bariatric full liquid    Complete by:  As directed    Discharge instructions    Complete by:  As directed    See bariatric discharge instructions   Incentive spirometry    Complete by:  As directed    Perform hourly while awake       Medication List    STOP taking these medications   lisinopril-hydrochlorothiazide 10-12.5 MG tablet Commonly known as:  PRINZIDE,ZESTORETIC     TAKE these medications   acetaminophen 500 MG tablet Commonly known as:  TYLENOL Take 1,000 mg by mouth every 6 (six) hours as needed for mild pain.    diphenhydrAMINE 25 MG tablet Commonly known as:  BENADRYL Take 50 mg by mouth every 6 (six) hours as needed for itching or sleep.   fluticasone 50 MCG/ACT nasal spray Commonly known as:  FLONASE Place 2 sprays into both nostrils daily.   multivitamin tablet Take 1 tablet by mouth daily. chewable   oxyCODONE 5 MG/5ML solution Commonly known as:  ROXICODONE Take 5-10 mLs (5-10 mg total) by mouth every 4 (four) hours as needed for moderate pain or severe pain.      Follow-up Information    Atilano InaWILSON,Evamae Rowen M, MD. Go on 11/09/2016.   Specialty:  General Surgery Why:  at 9:45 AM for post-op Contact information: [address] [place] [address] [phone]        Atilano InaWILSON,Kacee Koren M, MD Follow up.   Specialty:  General Surgery Contact information: [address] [place] [address] [phone]            The results of significant diagnostics from this hospitalization (including imaging, microbiology, ancillary and laboratory) are listed below for reference.    Significant Diagnostic Studies: Dg Chest 2 View  Result Date: 10/19/2016 CLINICAL DATA:  Pre bariatric screening. History of hypertension, former smoker, sleep apnea. EXAM: CHEST  2 VIEW COMPARISON:  None in PACs FINDINGS: The lungs are well-expanded. The interstitial markings are coarse. There is no alveolar infiltrate, parenchymal nodules, or pleural effusion. The heart and pulmonary vascularity are normal. There is calcification in the wall of the aortic arch. The bony thorax exhibits no acute abnormality. IMPRESSION: Mild chronic bronchitic -smoking related changes. No acute cardiopulmonary abnormality. Thoracic aortic atherosclerosis. Electronically Signed   By: David  SwazilandJordan M.D.   On: 10/19/2016 10:04    Labs: Basic Metabolic Panel:  Recent Labs Lab 10/26/16 0437  NA 138  K 4.5  CL 104  CO2 26  GLUCOSE 153*  BUN 10  CREATININE 0.85  CALCIUM 8.7*   Liver Function  Tests:  Recent Labs Lab 10/26/16 0437  AST 59*  ALT 56*  ALKPHOS 60  BILITOT 0.5  PROT 6.2*  ALBUMIN 3.6    CBC:  Recent Labs Lab 10/25/16 1109 10/26/16 0437 10/26/16 1751 10/27/16 0419  WBC  --  12.0*  --  9.8  NEUTROABS  --  8.7*  --  5.0  HGB 14.2 13.4 13.8 12.5  HCT 43.8 41.4 41.4 38.7  MCV  --  85.9  --  87.2  PLT  --  296  --  238    CBG: No results for input(s): GLUCAP in the last 168 hours.  Principal Problem:   Obesity, Class III, BMI 40-49.9 (morbid obesity) (HCC) Active Problems:   S/P gastric bypass   OSA on CPAP   Osteoarthritis of right knee   Lumbar and sacral osteoarthritis   Prediabetes   Low HDL (  under 40)   Time coordinating discharge: 15 min  Signed:  Atilano InaEric M Torben Soloway, MD [facility] [place] [phone] 10/27/2016, 9:58 AM

## 2016-10-27 NOTE — Progress Notes (Signed)
Pt's vitals are wnl, tolerating water and shakes, and pain is under control. Discussed discharge instructions with patient. All concerns and questions addressed. Discharged to home.

## 2016-11-08 ENCOUNTER — Encounter: Payer: BLUE CROSS/BLUE SHIELD | Attending: General Surgery | Admitting: Dietician

## 2016-11-08 DIAGNOSIS — M47817 Spondylosis without myelopathy or radiculopathy, lumbosacral region: Secondary | ICD-10-CM | POA: Diagnosis not present

## 2016-11-08 DIAGNOSIS — Z713 Dietary counseling and surveillance: Secondary | ICD-10-CM | POA: Insufficient documentation

## 2016-11-08 DIAGNOSIS — I10 Essential (primary) hypertension: Secondary | ICD-10-CM | POA: Diagnosis not present

## 2016-11-08 DIAGNOSIS — M1711 Unilateral primary osteoarthritis, right knee: Secondary | ICD-10-CM | POA: Insufficient documentation

## 2016-11-08 DIAGNOSIS — Z6841 Body Mass Index (BMI) 40.0 and over, adult: Secondary | ICD-10-CM | POA: Diagnosis not present

## 2016-11-08 DIAGNOSIS — R7303 Prediabetes: Secondary | ICD-10-CM | POA: Insufficient documentation

## 2016-11-09 NOTE — Progress Notes (Signed)
Bariatric Class:  Appt start time: 1530 end time:  1630.  2 Week Post-Operative Nutrition Class  Patient was seen on 11/08/16 for Post-Operative Nutrition education at [facility].   Surgery date: 10/25/2016 Surgery type: RYGB Start weight at [facility]: 313 lbs on 03/21/2016 Weight today: 296.4 lbs  Weight change: 12.4 lbs  TANITA  BODY COMP RESULTS  10/10/16 11/08/16   BMI (kg/m^2) 47 45.1   Fat Mass (lbs) 175.4 163.0   Fat Free Mass (lbs) 133.4 133.4   Total Body Water (lbs) 99.6 99.2   The following the learning objectives were met by the patient during this course:  Identifies Phase 3A (Soft, High Proteins) Dietary Goals and will begin from 2 weeks post-operatively to 2 months post-operatively  Identifies appropriate sources of fluids and proteins   States protein recommendations and appropriate sources post-operatively  Identifies the need for appropriate texture modifications, mastication, and bite sizes when consuming solids  Identifies appropriate multivitamin and calcium sources post-operatively  Describes the need for physical activity post-operatively and will follow MD recommendations  States when to call healthcare provider regarding medication questions or post-operative complications  Handouts given during class include:  Phase 3A: Soft, High Protein Diet Handout  Follow-Up Plan: Patient will follow-up at [facility] in 6 weeks for 2 month post-op nutrition visit for diet advancement per MD.

## 2016-11-24 ENCOUNTER — Telehealth (HOSPITAL_COMMUNITY): Payer: Self-pay

## 2016-12-19 ENCOUNTER — Encounter: Payer: Self-pay | Admitting: Skilled Nursing Facility1

## 2016-12-19 ENCOUNTER — Encounter: Payer: BLUE CROSS/BLUE SHIELD | Attending: General Surgery | Admitting: Skilled Nursing Facility1

## 2016-12-19 DIAGNOSIS — Z713 Dietary counseling and surveillance: Secondary | ICD-10-CM | POA: Diagnosis present

## 2016-12-19 DIAGNOSIS — R7303 Prediabetes: Secondary | ICD-10-CM | POA: Diagnosis not present

## 2016-12-19 DIAGNOSIS — M1711 Unilateral primary osteoarthritis, right knee: Secondary | ICD-10-CM | POA: Insufficient documentation

## 2016-12-19 DIAGNOSIS — M47817 Spondylosis without myelopathy or radiculopathy, lumbosacral region: Secondary | ICD-10-CM | POA: Insufficient documentation

## 2016-12-19 DIAGNOSIS — I10 Essential (primary) hypertension: Secondary | ICD-10-CM | POA: Diagnosis not present

## 2016-12-19 DIAGNOSIS — Z6841 Body Mass Index (BMI) 40.0 and over, adult: Secondary | ICD-10-CM | POA: Insufficient documentation

## 2016-12-19 NOTE — Patient Instructions (Addendum)
-  Aim for 64 ounces of fluid a day  -Try to aim for 2 sugar free possible a day for more fluid   -Try to look into finding a bariatric specific multivitamin   -Try to add in at least one snack of protein source   -Make sure your meat is very moist   -Do most of the work in the plate so you do not have to chew as much

## 2016-12-19 NOTE — Progress Notes (Addendum)
  Follow-up visit:  8 Weeks Post-Operative RYGB Surgery  Medical Nutrition Therapy:  Appt start time: 10:00 end time: 10:45  Primary concerns today: Post-operative Bariatric Surgery Nutrition Management. Pt states things have been slow referring to being able to eat a variety of food. Pt states she feels discomfort: weight, heavy, mouth tastes like bile pt states her antacid prescription every day. Pt states goes about 30 minutes in between drinks with 3-4 ounces at a time. Pt states she can drink the whole protein shake within 30 minutes.   Surgery date: 10/25/2016 Surgery type: RYGB [facility] lbs on 03/21/2016 Weight today: 280.8 lbs  Weight change: 15.6 lbs  TANITA  BODY COMP RESULTS  10/10/16 11/08/16 12/19/2016   BMI (kg/m^2) 47 45.1 42.7   Fat Mass (lbs) 175.4 163.0 156.4   Fat Free Mass (lbs) 133.4 133.4 124.4   Total Body Water (lbs) 99.6 99.2 92.2    Preferred Learning Style:  No preference indicated   Learning Readiness:   Change in progress  24-hr recall: B (AM): protein shake (30 grams of protein) Snk (AM):  L (PM): greek yogurt (12 grams protein) Snk (PM): dry cereal: cheerios  D (PM): chili (1/2 can 16 grams protein) Snk (PM):   Fluid intake: water: 20 ounces + protein shake 31 ounces of fluid  Estimated total protein intake: 58 grams protein   Medications: See List Supplementation: Multivitamin-just a one a day, Tums   Using straws: NO Drinking while eating: NO Hair loss: NO Carbonated beverages: NO N/V/D/C: A little bit a couple times since surgery, YES: twice since surgery, NO, YES: a couple times since surgery: pt states she only has 2 bowel movement once or twice a week  Recent physical activity:  Walking every day for 15-20 minutes   Progress Towards Goal(s):  In progress. Goals: -Aim for 64 ounces of fluid a day  -Try to aim for 2 sugar free possible a day for more fluid   -Try to look into finding a bariatric specific  multivitamin   -Try to add in at least one snack of protein source   -Make sure your meat is very moist   -Do most of the work in the plate so you do not have to chew as much Nutritional Diagnosis:  Nittany-3.3 Overweight/obesity related to past poor dietary habits and physical inactivity as evidenced by patient w/ recent RYGB surgery following dietary guidelines for continued weight loss.     Intervention:  Nutrition counseling. Dietitian educated the pt on meeting her fluid and protein needs.  Teaching Method Utilized:  Visual Auditory Hands on  Barriers to earning/adherence to lifestyle change: seemingly closed off through her body language   Demonstrated degree of understanding via:  Teach Back   Monitoring/Evaluation:  Dietary intake, exercise, lap band fills, and body weight.

## 2017-01-30 ENCOUNTER — Ambulatory Visit: Payer: Self-pay | Admitting: Skilled Nursing Facility1

## 2017-10-28 IMAGING — RF DG UGI W/ KUB
14 of 16 series · 14 of 16 positions shown · non-contrast
Comparison: None.

CLINICAL DATA: Pre-bariatric surgery evaluation.

EXAM:
UPPER GI SERIES WITH KUB
TECHNIQUE: After obtaining a scout radiograph a routine upper GI series was
performed using thin barium.
FLUOROSCOPY TIME:  Fluoroscopy Time (in minutes and seconds): 1
minutes 48 seconds.
Number of Acquired Images: 6 (additional acquired images were screen
captures).

[Series 1: t abdomen supine · 0.15mm/px · 1 of 1 slices shown]
[im 1/1]
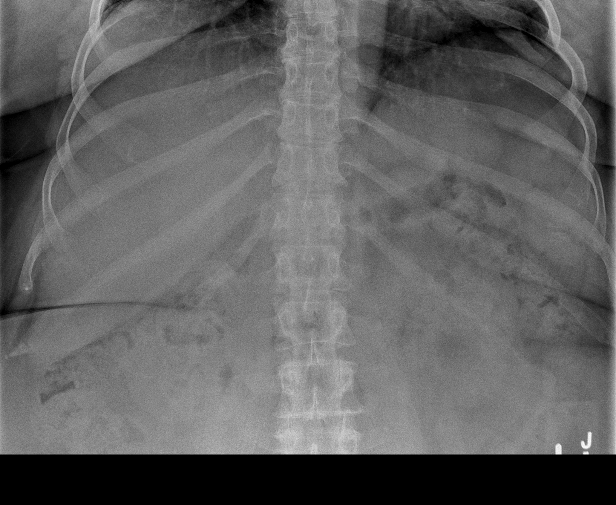

[Series 2: fluoro_barium 2fps_bw · 0.18mm/px · 1 of 1 slices shown (1 of 5)]
[im 1/1]
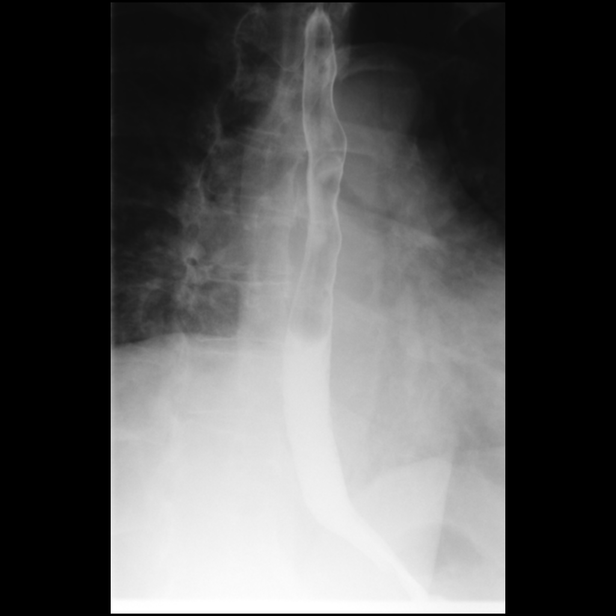

[Series 3: fluoro_barium 2fps_bw · 0.18mm/px · 1 of 1 slices shown (2 of 5)]
[im 1/1]
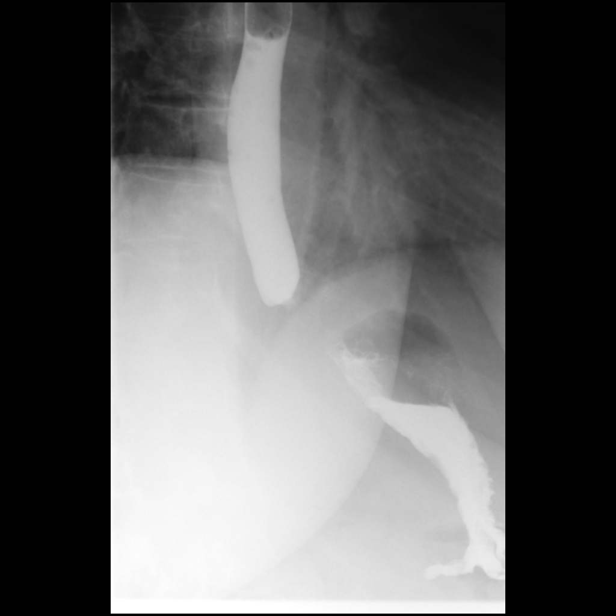

[Series 5: cp_standard · 0.18mm/px · 1 of 1 slices shown (1 of 8)]
[im 1/1]
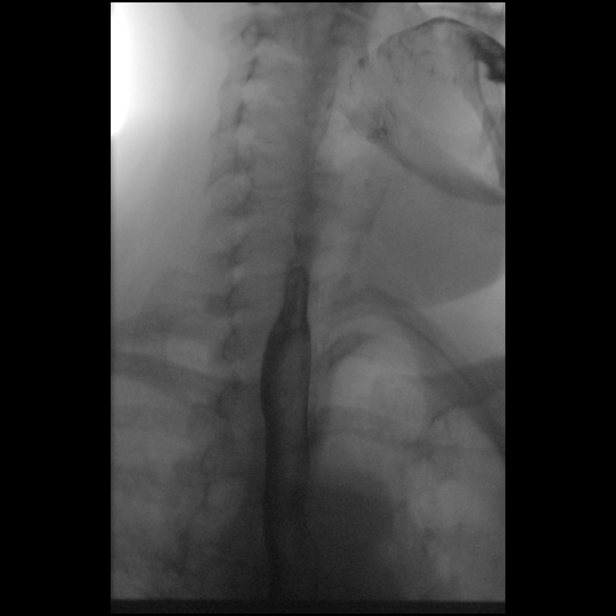

[Series 6: cp_standard · 0.18mm/px · 1 of 1 slices shown (2 of 8)]
[im 1/1]
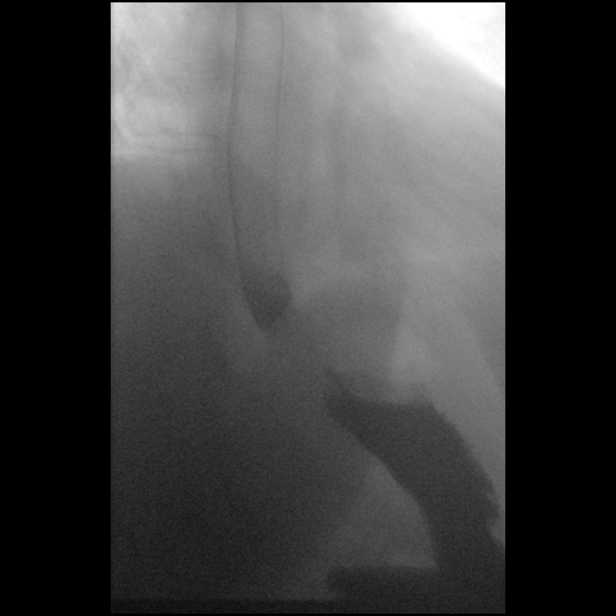

[Series 7: fluoro_barium 2fps_bw · 0.19mm/px · 1 of 1 slices shown (3 of 5)]
[im 1/1]
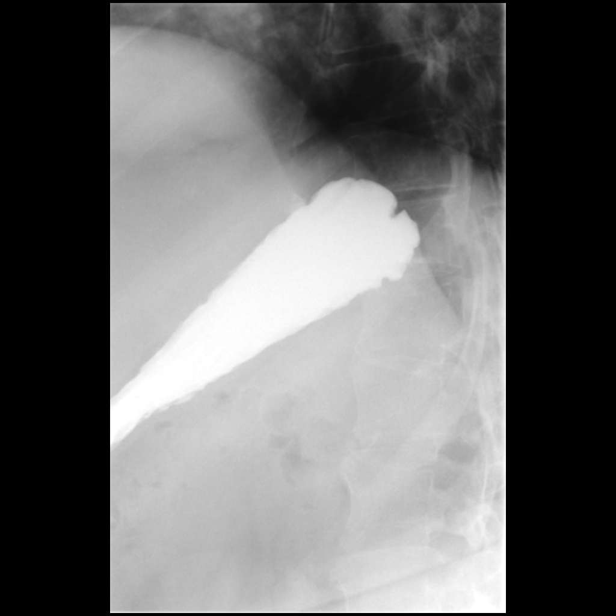

[Series 8: cp_standard · 0.20mm/px · 1 of 1 slices shown (3 of 8)]
[im 1/1]
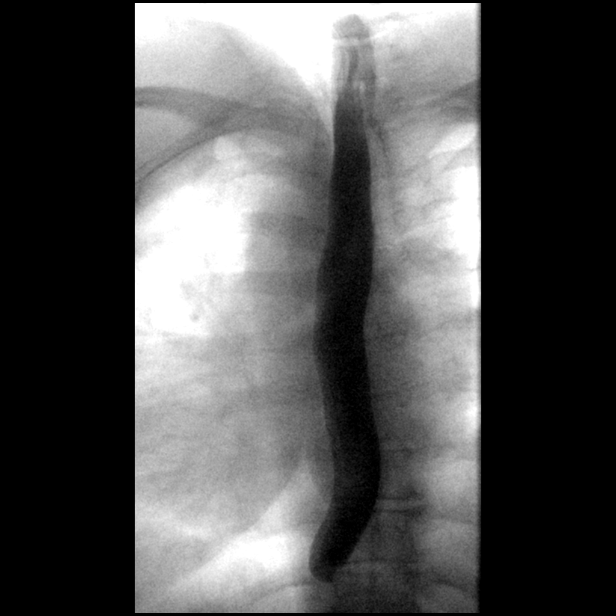

[Series 9: cp_standard · 0.20mm/px · 1 of 1 slices shown (4 of 8)]
[im 1/1]
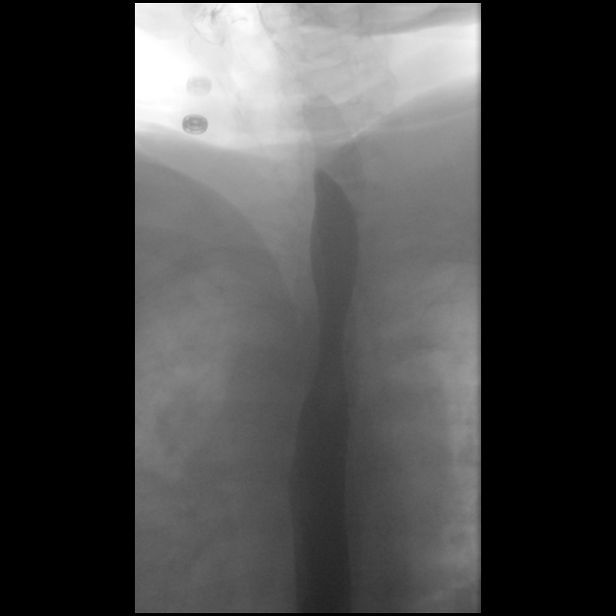

[Series 10: cp_standard · 0.20mm/px · 1 of 1 slices shown (5 of 8)]
[im 1/1]
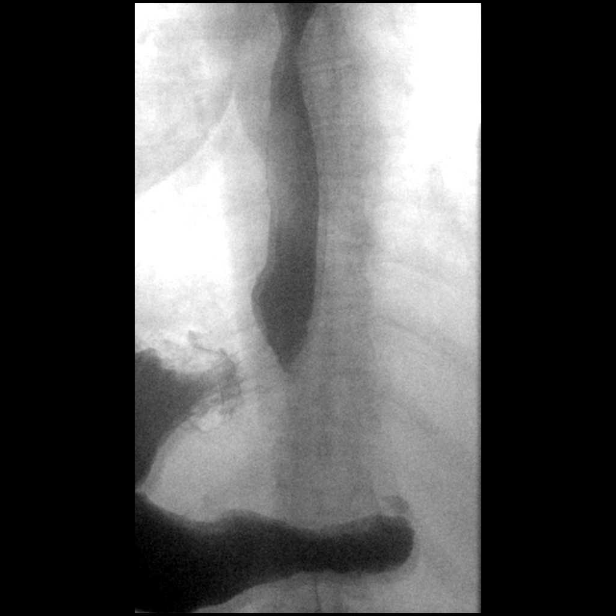

[Series 11: fluoro_barium 2fps_bw · 0.20mm/px · 1 of 1 slices shown (4 of 5)]
[im 1/1]
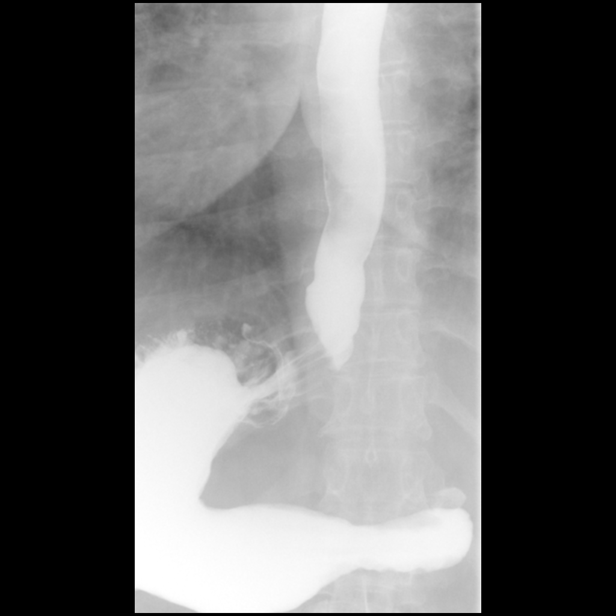

[Series 13: cp_standard · 0.20mm/px · 1 of 1 slices shown (6 of 8)]
[im 1/1]
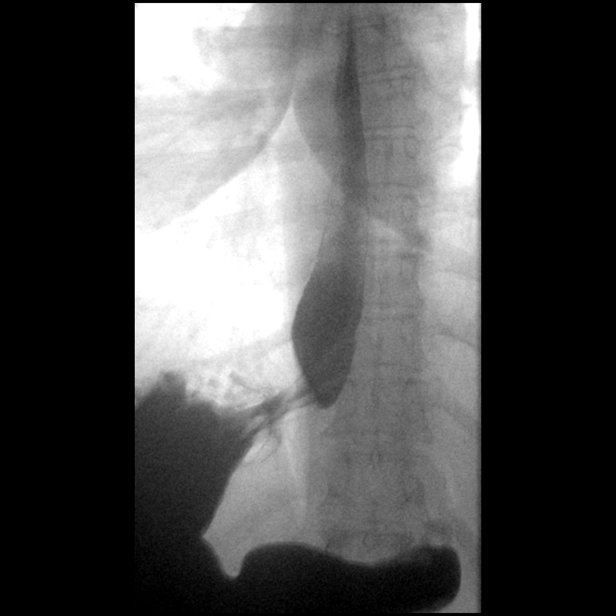

[Series 14: cp_standard · 0.20mm/px · 1 of 1 slices shown (7 of 8)]
[im 1/1]
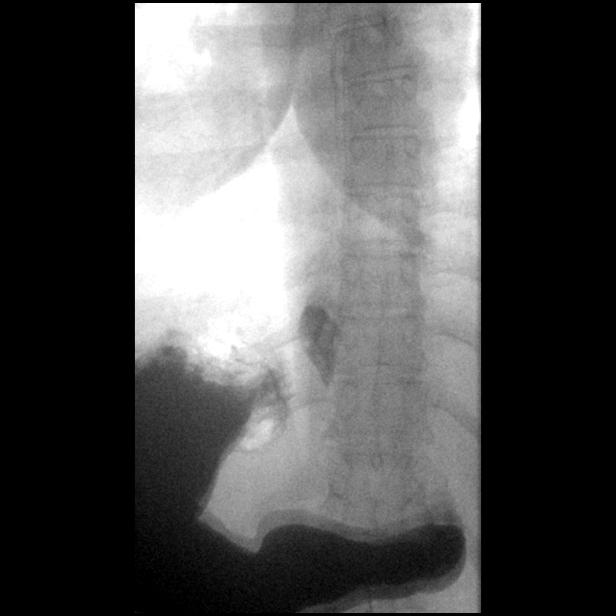

[Series 15: fluoro_barium 2fps_bw · 0.20mm/px · 1 of 1 slices shown (5 of 5)]
[im 1/1]
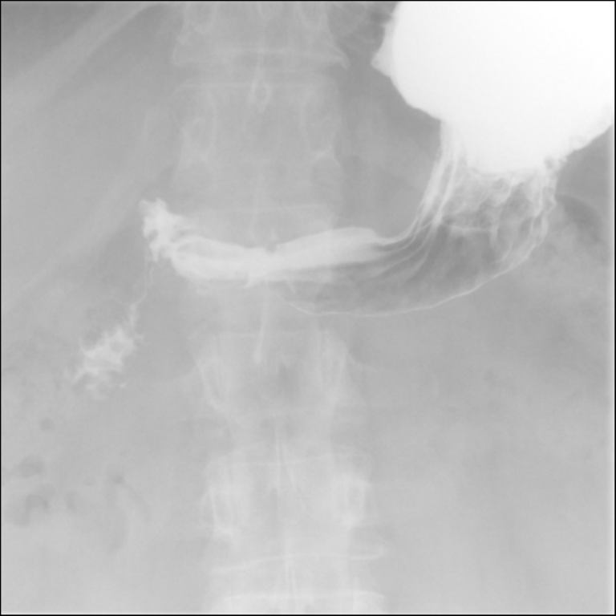

[Series 16: cp_standard · 0.29mm/px · 1 of 1 slices shown (8 of 8)]
[im 1/1]
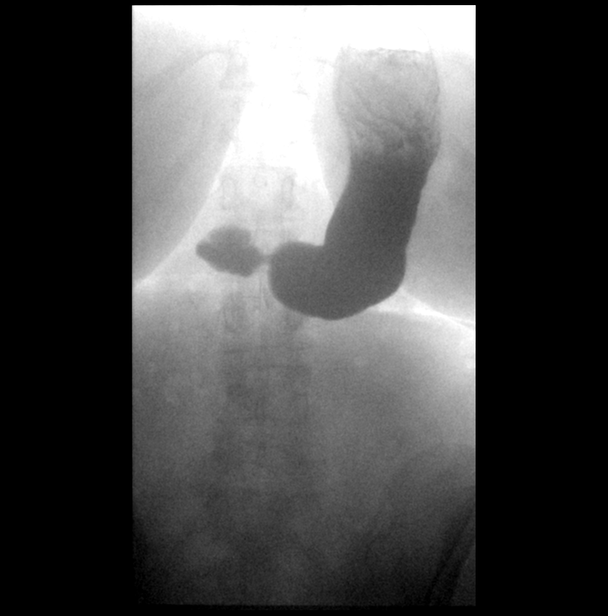

[14 of 16 positions shown; findings below may reference images not displayed]

FINDINGS: Scout radiograph demonstrates no dilated small bowel loops,
pneumatosis, pneumoperitoneum or pathologic soft tissue
calcifications. Physiologic stool in the visualized colon.

Normal esophageal motility. No hiatal hernia. No gastroesophageal
reflux elicited, despite provocative maneuvers including water
siphon test. Normal esophageal distensibility, with no esophageal
ulcer, stricture or mass detected. Gastric emptying was subjectively
delayed. No gross evidence of fold thickening, mass or ulcer in the
stomach. Visualized proximal duodenum is normal in caliber and
appearance, with no evidence of proximal duodenal fold thickening,
mass or ulcer. The distal duodenum and duodenal-jejunal junction
were not visualized on this study.
IMPRESSION: 1. Subjectively delayed gastric emptying.
2. Otherwise normal upper GI, with study limitations as described.

## 2018-02-21 ENCOUNTER — Telehealth: Payer: Self-pay | Admitting: Pulmonary Disease

## 2018-02-21 ENCOUNTER — Encounter: Payer: Self-pay | Admitting: Pulmonary Disease

## 2018-02-21 NOTE — Telephone Encounter (Signed)
3 attempts to schedule fu appt from recall list.  Mailed letter. Deleting recall.  ° °

## 2018-06-16 IMAGING — DX DG CHEST 2V
2 series · 2 of 2 positions shown · non-contrast
Comparison: None in PACs

CLINICAL DATA: Pre bariatric screening. History of hypertension,
former smoker, sleep apnea.

EXAM:
CHEST  2 VIEW

[chest pa]
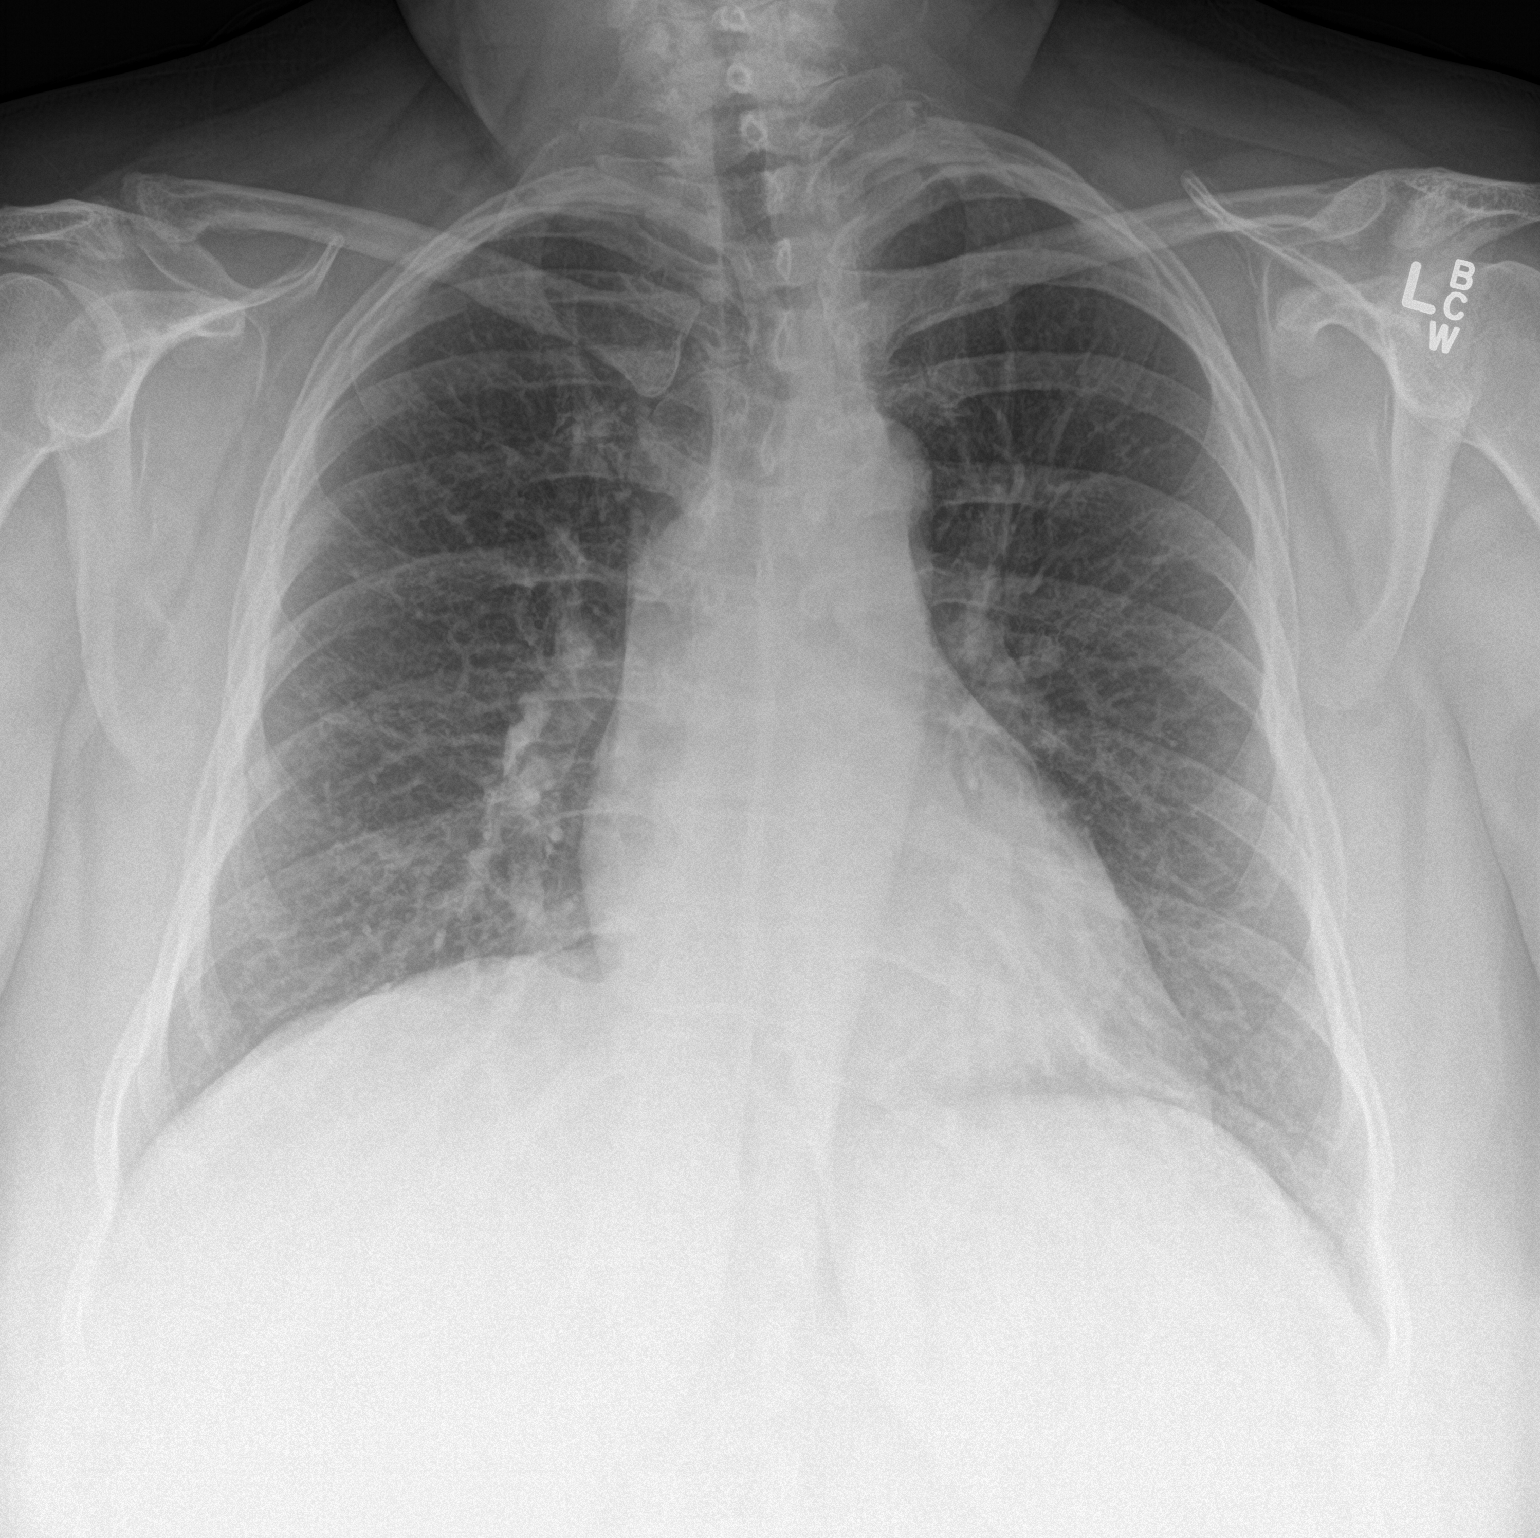

[chest lat]
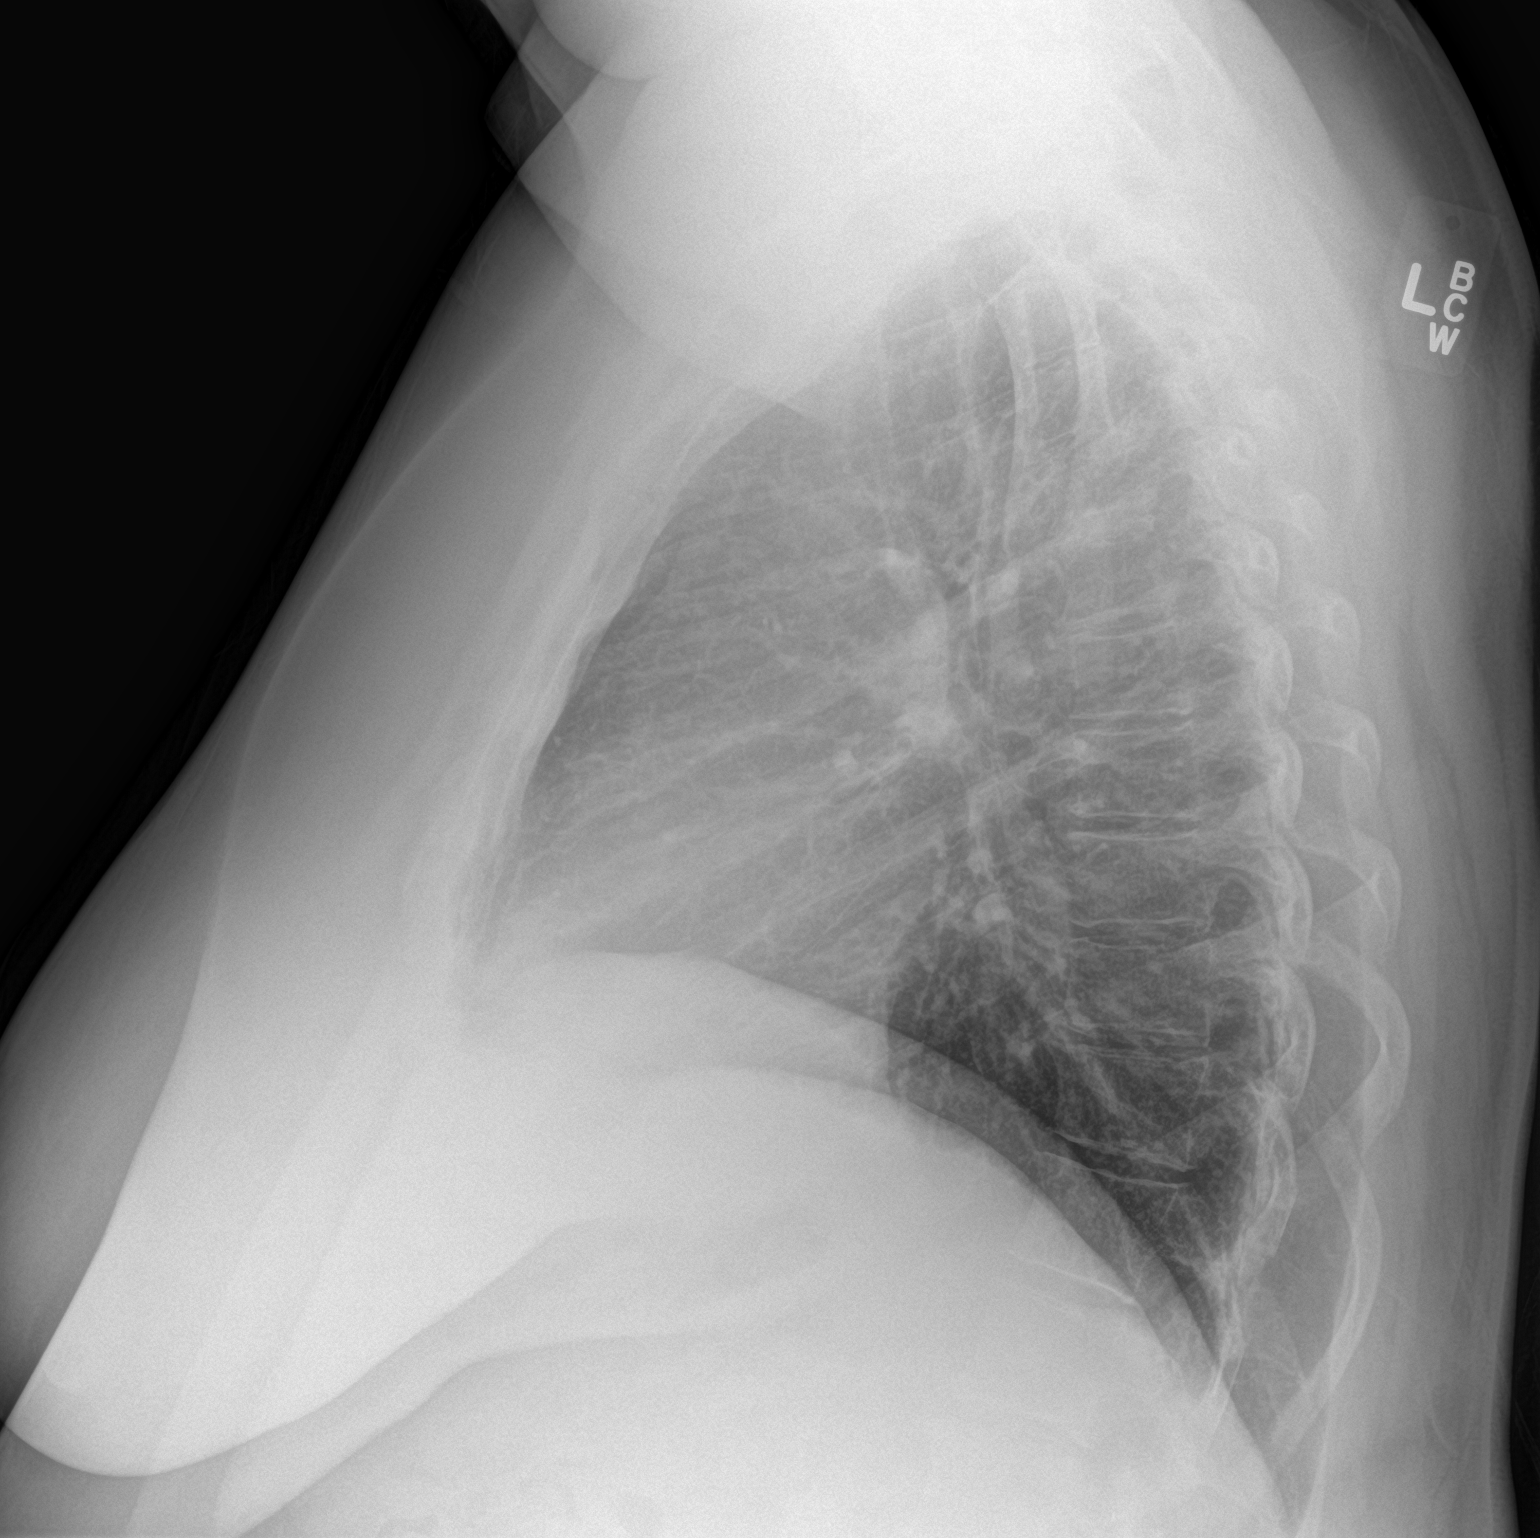

[2 of 2 positions shown; findings below may reference images not displayed]

FINDINGS: The lungs are well-expanded. The interstitial markings are coarse.
There is no alveolar infiltrate, parenchymal nodules, or pleural
effusion. The heart and pulmonary vascularity are normal. There is
calcification in the wall of the aortic arch. The bony thorax
exhibits no acute abnormality.
IMPRESSION: Mild chronic bronchitic -smoking related changes. No acute
cardiopulmonary abnormality.

Thoracic aortic atherosclerosis.

## 2019-08-16 ENCOUNTER — Encounter (HOSPITAL_COMMUNITY): Payer: Self-pay

## 2020-05-19 ENCOUNTER — Encounter (HOSPITAL_COMMUNITY): Payer: Self-pay

## 2021-05-21 ENCOUNTER — Encounter (HOSPITAL_COMMUNITY): Payer: Self-pay | Admitting: *Deleted

## 2024-12-05 ENCOUNTER — Encounter (HOSPITAL_COMMUNITY): Payer: Self-pay

## 2024-12-05 ENCOUNTER — Emergency Department (HOSPITAL_COMMUNITY)

## 2024-12-05 ENCOUNTER — Emergency Department (HOSPITAL_COMMUNITY)
Admission: EM | Admit: 2024-12-05 | Discharge: 2024-12-05 | Disposition: A | Discharge status: Home / Self Care | Attending: Emergency Medicine | Admitting: Emergency Medicine

## 2024-12-05 ENCOUNTER — Other Ambulatory Visit: Payer: Self-pay

## 2024-12-05 DIAGNOSIS — R0602 Shortness of breath: Secondary | ICD-10-CM | POA: Diagnosis not present

## 2024-12-05 DIAGNOSIS — R0789 Other chest pain: Secondary | ICD-10-CM | POA: Diagnosis present

## 2024-12-05 DIAGNOSIS — J40 Bronchitis, not specified as acute or chronic: Secondary | ICD-10-CM | POA: Insufficient documentation

## 2024-12-05 LAB — COMPREHENSIVE METABOLIC PANEL WITH GFR
ALT: 13 U/L (ref 0–44)
AST: 25 U/L (ref 15–41)
Albumin: 4.2 g/dL (ref 3.5–5.0)
Alkaline Phosphatase: 112 U/L (ref 38–126)
Anion gap: 13 (ref 5–15)
BUN: 13 mg/dL (ref 8–23)
CO2: 22 mmol/L (ref 22–32)
Calcium: 9.4 mg/dL (ref 8.9–10.3)
Chloride: 102 mmol/L (ref 98–111)
Creatinine, Ser: 0.84 mg/dL (ref 0.44–1.00)
GFR, Estimated: >60 mL/min
Glucose, Bld: 101 mg/dL — ABNORMAL HIGH (ref 70–99)
Potassium: 4 mmol/L (ref 3.5–5.1)
Sodium: 137 mmol/L (ref 135–145)
Total Bilirubin: 0.3 mg/dL (ref 0.0–1.2)
Total Protein: 6.9 g/dL (ref 6.5–8.1)

## 2024-12-05 LAB — CBC WITH DIFFERENTIAL/PLATELET
Abs Immature Granulocytes: 0.07 K/uL (ref 0.00–0.07)
Basophils Absolute: 0.1 K/uL (ref 0.0–0.1)
Basophils Relative: 2 %
Eosinophils Absolute: 0.3 K/uL (ref 0.0–0.5)
Eosinophils Relative: 3 %
HCT: 46.7 % — ABNORMAL HIGH (ref 36.0–46.0)
Hemoglobin: 15.7 g/dL — ABNORMAL HIGH (ref 12.0–15.0)
Immature Granulocytes: 1 %
Lymphocytes Relative: 38 %
Lymphs Abs: 2.7 K/uL (ref 0.7–4.0)
MCH: 28.5 pg (ref 26.0–34.0)
MCHC: 33.6 g/dL (ref 30.0–36.0)
MCV: 84.8 fL (ref 80.0–100.0)
Monocytes Absolute: 0.4 K/uL (ref 0.1–1.0)
Monocytes Relative: 5 %
Neutro Abs: 3.7 K/uL (ref 1.7–7.7)
Neutrophils Relative %: 51 %
Platelets: 402 K/uL — ABNORMAL HIGH (ref 150–400)
RBC: 5.51 MIL/uL — ABNORMAL HIGH (ref 3.87–5.11)
RDW: 12.3 % (ref 11.5–15.5)
WBC: 7.3 K/uL (ref 4.0–10.5)
nRBC: 0 % (ref 0.0–0.2)

## 2024-12-05 LAB — TROPONIN T, HIGH SENSITIVITY
Troponin T High Sensitivity: <15 ng/L (ref 0–19)
Troponin T High Sensitivity: <15 ng/L (ref 0–19)

## 2024-12-05 LAB — PRO BRAIN NATRIURETIC PEPTIDE: Pro Brain Natriuretic Peptide: <50 pg/mL

## 2024-12-05 MED ORDER — PREDNISONE 20 MG PO TABS: ORAL_TABLET | ORAL | 0 refills | Status: AC

## 2024-12-05 MED ORDER — PREDNISONE 20 MG PO TABS
60.0000 mg | ORAL_TABLET | Freq: Once | ORAL | Status: AC
  Administered 2024-12-05: 60 mg via ORAL
  Filled 2024-12-05: qty 3

## 2024-12-05 MED ORDER — PREDNISONE 20 MG PO TABS: ORAL_TABLET | ORAL | 0 refills | Status: DC

## 2024-12-05 NOTE — ED Provider Triage Note (Signed)
 Emergency Medicine Provider Triage Evaluation Note  Haley Higgins , a 64 y.o. female  was evaluated in triage.  Pt complains of increasing chest pressure and not feeling well for 2 weeks.  States shortness of breath getting worse.  Notes cough started 3 days ago.  Seen by PCP and urgent care with no improvement.  Has been taking Augmentin for 5 days.  No previous heart or lung history.  Former smoker.  Review of Systems  Positive: Chest pressure, cough, headaches shortness of breath Negative: Fevers, dizziness, syncope, abdominal pain, nausea/vomiting  Physical Exam  BP 121/85 (BP Location: Right Arm)   Pulse 76   Temp 97.7 F (36.5 C) (Oral)   Resp 18   SpO2 98%  Gen:   Awake, no distress   Resp:  Normal effort  MSK:   Moves extremities without difficulty  Other:    Medical Decision Making  Medically screening exam initiated at 4:08 PM.  Appropriate orders placed.  Ova Gillentine was informed that the remainder of the evaluation will be completed by another provider, this initial triage assessment does not replace that evaluation, and the importance of remaining in the ED until their evaluation is complete.     Neysa Thersia RAMAN, [place] 12/05/24 1610

## 2024-12-05 NOTE — ED Provider Notes (Signed)
 " [place] EMERGENCY DEPARTMENT AT [facility] Provider Note   CSN: [id number] Arrival date & time: 12/05/24  1552     Patient presents with: Shortness of Breath   Haley Higgins is a 64 y.o. female.   64 yo F with a chief complaints of cough congestion chest tightness.  This has been going on for about 2 weeks now.  She went to urgent care and was prescribed Augmentin.  She does not feel like its gotten any better.  Persistent dry cough.   Shortness of Breath      Prior to Admission medications  Medication Sig Start Date End Date Taking? Authorizing Provider  acetaminophen  (TYLENOL ) 500 MG tablet Take 1,000 mg by mouth every 6 (six) hours as needed for mild pain.     [provider]  diphenhydrAMINE  (BENADRYL ) 25 MG tablet Take 50 mg by mouth every 6 (six) hours as needed for itching or sleep.     [provider]  fluticasone  (FLONASE ) 50 MCG/ACT nasal spray Place 2 sprays into both nostrils daily. 08/29/16   Linard Alm NOVAK, MD  Multiple Vitamin (MULTIVITAMIN) tablet Take 1 tablet by mouth daily. chewable    [provider]  oxyCODONE  (ROXICODONE ) 5 MG/5ML solution Take 5-10 mLs (5-10 mg total) by mouth every 4 (four) hours as needed for moderate pain or severe pain. 10/27/16   Tanda Locus, MD  predniSONE  (DELTASONE ) 20 MG tablet 2 tabs po daily x 4 days 12/05/24   Falicity Sheets, DO    Allergies: Patient has no known allergies.    Review of Systems  Respiratory:  Positive for shortness of breath.     Updated Vital Signs BP 103/82 (BP Location: Right Arm)   Pulse 69   Temp 97.8 F (36.6 C) (Oral)   Resp 18   Ht 5' 9 (1.753 m)   Wt 113.4 kg   SpO2 98%   BMI 36.92 kg/m   Physical Exam Vitals and nursing note reviewed.  Constitutional:      General: She is not in acute distress.    Appearance: She is well-developed. She is not diaphoretic.  HENT:     Head: Normocephalic and atraumatic.     Comments: Swollen turbinates, posterior  nasal drip,  tm normal bilaterally.   Eyes:     Pupils: Pupils are equal, round, and reactive to light.  Cardiovascular:     Rate and Rhythm: Normal rate and regular rhythm.     Heart sounds: No murmur heard.    No friction rub. No gallop.  Pulmonary:     Effort: Pulmonary effort is normal.     Breath sounds: No wheezing or rales.  Abdominal:     General: There is no distension.     Palpations: Abdomen is soft.     Tenderness: There is no abdominal tenderness.  Musculoskeletal:        General: No tenderness.     Cervical back: Normal range of motion and neck supple.  Skin:    General: Skin is warm and dry.  Neurological:     Mental Status: She is alert and oriented to person, place, and time.  Psychiatric:        Behavior: Behavior normal.     (all labs ordered are listed, but only abnormal results are displayed) Labs Reviewed  COMPREHENSIVE METABOLIC PANEL WITH GFR - Abnormal; Notable for the following components:      Result Value   Glucose, Bld 101 (*)  All other components within normal limits  CBC WITH DIFFERENTIAL/PLATELET - Abnormal; Notable for the following components:   RBC 5.51 (*)    Hemoglobin 15.7 (*)    HCT 46.7 (*)    Platelets 402 (*)    All other components within normal limits  PRO BRAIN NATRIURETIC PEPTIDE  TROPONIN T, HIGH SENSITIVITY  TROPONIN T, HIGH SENSITIVITY    EKG: EKG Interpretation Date/Time:  Thursday December 05 2024 16:10:11 EST Ventricular Rate:  82 PR Interval:  166 QRS Duration:  88 QT Interval:  388 QTC Calculation: 453 R Axis:   50  Text Interpretation: Normal sinus rhythm Low voltage QRS Borderline ECG No old tracing to compare Confirmed by Emil Share [phone]) on 12/05/2024 6:42:27 PM  Radiology: [facility] Chest 2 View Result Date: 12/05/2024 CLINICAL DATA:  Shortness of breath. EXAM: CHEST - 2 VIEW COMPARISON:  Chest radiograph dated 10/19/2016. FINDINGS: The heart size and mediastinal contours are within normal limits. Both  lungs are clear. The visualized skeletal structures are unremarkable. IMPRESSION: No active cardiopulmonary disease. Electronically Signed   By: Vanetta Chou M.D.   On: 12/05/2024 16:42     Procedures   Medications Ordered in the ED  predniSONE  (DELTASONE ) tablet 60 mg (60 mg Oral Given 12/05/24 1953)                                    Medical Decision Making Risk Prescription drug management.   64 yo F with a chief complaint of cough congestion.  This been going on for a couple weeks now.  By history it sounds like the patient has a viral syndrome.  She is describing some chest heaviness.  X-ray only independent to rotation without focal infiltrate or pneumothorax.  She has clear lung sounds for me.  No hypoxia no tachypnea.  Troponin negative BNP negative.  Trial of steroids.  PCP follow-up.  8:04 PM:  I have discussed the diagnosis/risks/treatment options with the patient.  Evaluation and diagnostic testing in the emergency department does not suggest an emergent condition requiring admission or immediate intervention beyond what has been performed at this time.  They will follow up with PCP. We also discussed returning to the ED immediately if new or worsening sx occur. We discussed the sx which are most concerning (e.g., sudden worsening pain, fever, inability to tolerate by mouth) that necessitate immediate return. Medications administered to the patient during their visit and any new prescriptions provided to the patient are listed below.  Medications given during this visit Medications  predniSONE  (DELTASONE ) tablet 60 mg (60 mg Oral Given 12/05/24 1953)     The patient appears reasonably screen and/or stabilized for discharge and I doubt any other medical condition or other [facility] requiring further screening, evaluation, or treatment in the ED at this time prior to discharge.       Final diagnoses:  Bronchitis    ED Discharge Orders          Ordered    predniSONE   (DELTASONE ) 20 MG tablet  Status:  Discontinued        12/05/24 1937    predniSONE  (DELTASONE ) 20 MG tablet        12/05/24 2003               Emil Share, DO 12/05/24 2004  "

## 2024-12-05 NOTE — Discharge Instructions (Signed)
 Take tylenol 2 pills 4 times a day and motrin 4 pills 3 times a day.  Drink plenty of fluids.  Return for worsening shortness of breath, headache, confusion. Follow up with your family doctor.

## 2024-12-05 NOTE — ED Triage Notes (Signed)
 C/O [facility] for 2 weeks. Pt finished abx for bronchitis. C/O chest pressure/dry cough. Denies nausea/ vomiting
